# Patient Record
Sex: Female | Born: 1980
Health system: Southern US, Community
[De-identification: ages and names within clinical notes are randomized; demographics above are authoritative.]

## PROBLEM LIST (undated history)

## (undated) DIAGNOSIS — F502 Bulimia nervosa, unspecified: Secondary | ICD-10-CM

## (undated) DIAGNOSIS — I10 Essential (primary) hypertension: Secondary | ICD-10-CM

## (undated) DIAGNOSIS — R51 Headache: Secondary | ICD-10-CM

## (undated) HISTORY — DX: Bulimia nervosa: F50.2

## (undated) HISTORY — DX: Bulimia nervosa, unspecified: F50.20

## (undated) HISTORY — DX: Headache: R51

## (undated) HISTORY — DX: Essential (primary) hypertension: I10

---
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
## 2000-04-17 ENCOUNTER — Other Ambulatory Visit: Admission: RE | Admit: 2000-04-17 | Discharge: 2000-04-17 | Payer: Self-pay | Admitting: Family Medicine

## 2001-04-02 ENCOUNTER — Other Ambulatory Visit: Admission: RE | Admit: 2001-04-02 | Discharge: 2001-04-02 | Payer: Self-pay | Admitting: *Deleted

## 2002-03-28 DIAGNOSIS — R51 Headache: Secondary | ICD-10-CM

## 2002-03-28 HISTORY — DX: Headache: R51

## 2002-04-06 ENCOUNTER — Other Ambulatory Visit: Admission: RE | Admit: 2002-04-06 | Discharge: 2002-04-06 | Payer: Self-pay | Admitting: *Deleted

## 2003-04-12 ENCOUNTER — Other Ambulatory Visit: Admission: RE | Admit: 2003-04-12 | Discharge: 2003-04-12 | Payer: Self-pay | Admitting: *Deleted

## 2004-05-09 ENCOUNTER — Other Ambulatory Visit: Admission: RE | Admit: 2004-05-09 | Discharge: 2004-05-09 | Payer: Self-pay | Admitting: *Deleted

## 2005-05-10 ENCOUNTER — Other Ambulatory Visit: Admission: RE | Admit: 2005-05-10 | Discharge: 2005-05-10 | Payer: Self-pay | Admitting: Obstetrics and Gynecology

## 2006-05-13 ENCOUNTER — Other Ambulatory Visit: Admission: RE | Admit: 2006-05-13 | Discharge: 2006-05-13 | Payer: Self-pay | Admitting: Obstetrics & Gynecology

## 2007-05-28 ENCOUNTER — Other Ambulatory Visit: Admission: RE | Admit: 2007-05-28 | Discharge: 2007-05-28 | Payer: Self-pay | Admitting: Obstetrics & Gynecology

## 2008-06-03 ENCOUNTER — Other Ambulatory Visit: Admission: RE | Admit: 2008-06-03 | Discharge: 2008-06-03 | Payer: Self-pay | Admitting: Obstetrics & Gynecology

## 2010-09-26 ENCOUNTER — Ambulatory Visit (INDEPENDENT_AMBULATORY_CARE_PROVIDER_SITE_OTHER): Payer: BC Managed Care – PPO | Admitting: Family Medicine

## 2010-09-26 ENCOUNTER — Encounter: Payer: Self-pay | Admitting: Family Medicine

## 2010-09-26 VITALS — BP 130/87 | HR 85 | Ht 69.0 in | Wt 170.0 lb

## 2010-09-26 DIAGNOSIS — M6750 Plica syndrome, unspecified knee: Secondary | ICD-10-CM

## 2010-09-26 DIAGNOSIS — M6751 Plica syndrome, right knee: Secondary | ICD-10-CM | POA: Insufficient documentation

## 2010-09-26 NOTE — Progress Notes (Signed)
Patient presents with 12 day h/o r sided knee pain after running. No audible pop was heard. The patient has not had an effusion. No symptomatic giving-way. No mechanical clicking. Joint has not locked up. Patient has been able to walk but has not been able to run. The patient does have pain going up and down stairs or rising from a seated position.   Pain location: medial Current physical activity: runs 15 miles a week Prior Knee Surgery: none Bracing: none  The PMH, PSH, Social History, Family History, Medications, and allergies have been reviewed in [place], and have been updated if relevant.  GEN: Well-developed,well-nourished,in no acute distress; alert,appropriate and cooperative throughout examination HEENT: Normocephalic and atraumatic without obvious abnormalities. Ears, externally no deformities PULM: Breathing comfortably in no respiratory distress EXT: No clubbing, cyanosis, or edema PSYCH: Normally interactive. Cooperative during the interview. Pleasant. Friendly and conversant. Not anxious or depressed appearing. Normal, full affect.  Gait: Normal heel toe pattern ROM: WNL Effusion: neg Echymosis or edema: none Patellar tendon NT Painful PLICA: VERY TENDER MEDIAL PLICA BAND Patellar grind: negative Medial and lateral patellar facet loading: negative medial and lateral joint lines:NT Mcmurray's neg Flexion-pinch neg Varus and valgus stress: stable Lachman: neg Ant and Post drawer: neg Hip abduction, IR, ER: WNL Hip flexion str: 5/5 Hip abd: 5/5 Quad: 5/5 VMO atrophy:No Hamstring concentric and eccentric: 5/5  A/P: Knee Pain, Plica syndrome  Plica Syndrome  Painful plica band ID'd on exam Massage for 8 weeks with hand multiple times a day and with an ice cube  If fails, would do a plica injection along band of tissue.  Ultimately, if all treatment fails, surgical synovectomy could be considered.

## 2010-11-21 ENCOUNTER — Ambulatory Visit: Payer: BC Managed Care – PPO | Admitting: Family Medicine

## 2011-12-30 ENCOUNTER — Ambulatory Visit (INDEPENDENT_AMBULATORY_CARE_PROVIDER_SITE_OTHER): Payer: BC Managed Care – PPO | Admitting: Family Medicine

## 2011-12-30 VITALS — BP 155/90 | HR 81 | Temp 98.7°F | Resp 16 | Ht 69.5 in | Wt 168.0 lb

## 2011-12-30 DIAGNOSIS — G43909 Migraine, unspecified, not intractable, without status migrainosus: Secondary | ICD-10-CM

## 2011-12-30 DIAGNOSIS — G44209 Tension-type headache, unspecified, not intractable: Secondary | ICD-10-CM

## 2011-12-30 MED ORDER — TIZANIDINE HCL 4 MG PO TABS: 4.0000 mg | ORAL_TABLET | Freq: Four times a day (QID) | ORAL | Status: AC | PRN

## 2011-12-30 NOTE — Progress Notes (Signed)
31 year old female who is here with a history of having had migraines in the past. She had some chiropractic work done last year and has not had migraines since then until recently. She had a major visual aura that lasted about 40 minutes. She went to see her eye doctor who sent her to her PCP. The PCP assessed her and felt this was a migraine aura without headache. She had a carotid test done at that time which was normal. Last night she had given developed a headache. It started behind her left eye back to the back of her neck. Her husband did feel a knot over the lateral aspect of the neck. He is concerned this could be of arterial not. The headache is down relief. She took some BC powder. She is otherwise a healthy lady. She is a runner, and both of these episodes happen happened when she been running and probably get to dry on hot days.  Objective: Pleasant alert oriented lady in no acute distress. TMs normal. Eyes PERRLA. EOMs intact. Throat clear. Neck supple without nodes thyromegaly. No carotid bruits. Chest clear to auscultation. Heart regular without murmurs.  Assessment: Migraine with tension vascular component  Plan: Give her some Zanaflex to have on hand for if this happens again. Return if worse like

## 2011-12-30 NOTE — Patient Instructions (Addendum)
Migraine Headache A migraine headache is an intense, throbbing pain on one or both sides of your head. The exact cause of a migraine headache is not always known. A migraine may be caused when nerves in the brain become irritated and release chemicals that cause swelling within blood vessels, causing pain. Many migraine sufferers have a family history of migraines. Before you get a migraine you may or may not get an aura. An aura is a group of symptoms that can predict the beginning of a migraine. An aura may include:  Visual changes such as:   Flashing lights.   Bright spots or zig-zag lines.   Tunnel vision.   Feelings of numbness.   Trouble talking.   Muscle weakness.  SYMPTOMS  Pain on one or both sides of your head.   Pain that is pulsating or throbbing in nature.   Pain that is severe enough to prevent daily activities.   Pain that is aggravated by any daily physical activity.   Nausea (feeling sick to your stomach), vomiting, or both.   Pain with exposure to bright lights, loud noises, or activity.   General sensitivity to bright lights or loud noises.  MIGRAINE TRIGGERS Examples of triggers of migraine headaches include:   Alcohol.   Smoking.   Stress.   It may be related to menses (female menstruation).   Aged cheeses.   Foods or drinks that contain nitrates, glutamate, aspartame, or tyramine.   Lack of sleep.   Chocolate.   Caffeine.   Hunger.   Medications such as nitroglycerine (used to treat chest pain), birth control pills, estrogen, and some blood pressure medications.  DIAGNOSIS  A migraine headache is often diagnosed based on:  Symptoms.   Physical examination.   A computerized X-ray scan (computed tomography, CT) of your head.  TREATMENT  Medications can help prevent migraines if they are recurrent or should they become recurrent. Your caregiver can help you with a medication or treatment program that will be helpful to you.   Lying  down in a dark, quiet room may be helpful.   Keeping a headache diary may help you find a trend as to what may be triggering your headaches.  SEEK IMMEDIATE MEDICAL CARE IF:   You have confusion, personality changes or seizures.   You have headaches that wake you from sleep.   You have an increased frequency in your headaches.   You have a stiff neck.   You have a loss of vision.   You have muscle weakness.   You start losing your balance or have trouble walking.   You feel faint or pass out.  MAKE SURE YOU:   Understand these instructions.   Will watch your condition.   Will get help right away if you are not doing well or get worse.  Document Released: 05/14/2005 Document Revised: 05/03/2011 Document Reviewed: 12/28/2008 [facility] Patient Information 2012 [place], [place].  Try 1/2 pill of zanaflex and see if that will help

## 2012-09-03 ENCOUNTER — Other Ambulatory Visit: Payer: Self-pay | Admitting: Obstetrics & Gynecology

## 2012-09-03 ENCOUNTER — Other Ambulatory Visit: Payer: Self-pay

## 2012-09-03 MED ORDER — DROSPIRENONE-ETHINYL ESTRADIOL 3-0.02 MG PO TABS: 1.0000 | ORAL_TABLET | Freq: Every day | ORAL | Status: DC

## 2012-09-03 NOTE — Telephone Encounter (Signed)
Pharmacy request [facility] for patient supply. Pt has aex 09/04/12

## 2012-09-04 ENCOUNTER — Ambulatory Visit (INDEPENDENT_AMBULATORY_CARE_PROVIDER_SITE_OTHER): Payer: Federal, State, Local not specified - PPO | Admitting: Obstetrics & Gynecology

## 2012-09-04 ENCOUNTER — Encounter: Payer: Self-pay | Admitting: Obstetrics & Gynecology

## 2012-09-04 VITALS — BP 138/88 | Ht 69.0 in | Wt 165.0 lb

## 2012-09-04 DIAGNOSIS — Z01419 Encounter for gynecological examination (general) (routine) without abnormal findings: Secondary | ICD-10-CM

## 2012-09-04 DIAGNOSIS — R809 Proteinuria, unspecified: Secondary | ICD-10-CM

## 2012-09-04 DIAGNOSIS — Z0000 Encounter for general adult medical examination without abnormal findings: Secondary | ICD-10-CM

## 2012-09-04 LAB — POCT URINALYSIS DIPSTICK
Bilirubin, UA: NEGATIVE
Blood, UA: NEGATIVE
Glucose, UA: NEGATIVE
Nitrite, UA: NEGATIVE
Urobilinogen, UA: NEGATIVE
pH, UA: 5

## 2012-09-04 MED ORDER — DROSPIRENONE-ETHINYL ESTRADIOL 3-0.02 MG PO TABS: 1.0000 | ORAL_TABLET | Freq: Every day | ORAL | Status: DC

## 2012-09-04 NOTE — Progress Notes (Signed)
32 y.o. G0P0000 UnknownCaucasianF here for annual exam.  Reports having cyst from scalp removed in December.  Was benign. Cycles are regular.  Work is very Biochemist, clinical for a new job.  Has noticed right nipple is more erect than the opposite side.  She did have some itching at the same time.  She was in the [place] on vacation when she first noticed this.    Patient's last menstrual period was 08/29/2012.          Sexually active: yes  The current method of family planning is OCP (estrogen/progesterone).    Exercising: yes  running and weights Smoker:  no  Health Maintenance: Pap:  08/08/11 WNL/negative HR HPV MMG:  none Colonoscopy:  none BMD:   none TDaP:  2008   reports that she has never smoked. She has never used smokeless tobacco. She reports that  drinks alcohol. She reports that she does not use illicit drugs.  Past Medical History  Diagnosis Date  . Bulimic   . Headache 11/03    Past Surgical History  Procedure Laterality Date  . Knee arthroscopy  1999    right knee    Current Outpatient Prescriptions  Medication Sig Dispense Refill  . drospirenone-ethinyl estradiol (GIANVI) 3-0.02 MG tablet Take 1 tablet by mouth daily.  3 Package  0  . Multiple Vitamins-Minerals (MULTIVITAMIN PO) Take by mouth daily.       No current facility-administered medications for this visit.    Family History  Problem Relation Age of Onset  . Breast cancer Paternal Grandmother     ROS:  Pertinent items are noted in HPI.  Otherwise, a comprehensive ROS was negative.  Exam:   BP 138/88  Ht 5\' 9"  (1.753 m)  Wt 165 lb (74.844 kg)  BMI 24.36 kg/m2  LMP 08/29/2012  Height:   Height: 5\' 9"  (175.3 cm)  Ht Readings from Last 3 Encounters:  09/04/12 5\' 9"  (1.753 m)  12/30/11 5' 9.5" (1.765 m)  09/26/10 5\' 9"  (1.753 m)    General appearance: alert, cooperative and appears stated age Head: Normocephalic, without obvious abnormality, atraumatic Neck: no adenopathy, supple,  symmetrical, trachea midline and thyroid normal to inspection and palpation Lungs: clear to auscultation bilaterally Breasts: normal appearance, no masses or tenderness Heart: regular rate and rhythm Abdomen: soft, non-tender; bowel sounds normal; no masses,  no organomegaly Extremities: extremities normal, atraumatic, no cyanosis or edema Skin: Skin color, texture, turgor normal. No rashes or lesions Lymph nodes: Cervical, supraclavicular, and axillary nodes normal. No abnormal inguinal nodes palpated Neurologic: Grossly normal   Pelvic: External genitalia:  no lesions              Urethra:  normal appearing urethra with no masses, tenderness or lesions              Bartholins and Skenes: normal                 Vagina: normal appearing vagina with normal color and discharge, no lesions              Cervix: no lesions              Pap taken: no Bimanual Exam:  Uterus:  normal size, contour, position, consistency, mobility, non-tender              Adnexa: no mass, fullness, tenderness               Rectovaginal: Confirms  Anus:  normal sphincter tone, no lesions  A:  Well Woman with normal exam H/o migraines without aura that resolved after seeing chiropractor Family Hx of colon polyps (mat aunt and mat uncle ages 21 and 55) Remote H/O bulemia Increased BP with increased stress at work Proteinuria on Dip U/A today  P:  RF for birth control given.  She knows with htn, there is an increased stroke risk.  She will get BP cuff and check BPs.  We will check on her in about two months. Return after hydrating well for urinalysis.  If protein still present, check 24 hour urine return annually or prn  An After Visit Summary was printed and given to the patient.

## 2012-09-04 NOTE — Patient Instructions (Signed)

## 2012-09-05 LAB — URINALYSIS, MICROSCOPIC ONLY
Bacteria, UA: NONE SEEN
Crystals: NONE SEEN
Squamous Epithelial / LPF: NONE SEEN

## 2012-09-10 ENCOUNTER — Other Ambulatory Visit: Payer: Self-pay | Admitting: Obstetrics & Gynecology

## 2012-09-10 ENCOUNTER — Ambulatory Visit (INDEPENDENT_AMBULATORY_CARE_PROVIDER_SITE_OTHER): Payer: Federal, State, Local not specified - PPO

## 2012-09-10 DIAGNOSIS — R809 Proteinuria, unspecified: Secondary | ICD-10-CM

## 2012-09-15 ENCOUNTER — Other Ambulatory Visit: Payer: Self-pay | Admitting: Obstetrics & Gynecology

## 2012-09-15 ENCOUNTER — Other Ambulatory Visit: Payer: Federal, State, Local not specified - PPO

## 2012-09-15 ENCOUNTER — Encounter: Payer: Self-pay | Admitting: *Deleted

## 2012-09-15 DIAGNOSIS — R809 Proteinuria, unspecified: Secondary | ICD-10-CM

## 2012-09-15 NOTE — Progress Notes (Signed)
Here for urine check again.  Urine sample last week leaked and there was not enough for the test.    Patient has been checking BP's at home--systolic 140's to 160's/55-60's.  No headache or blurry vision.  Reports mother and maternal grandmother with hypertension and on medication.  Advised to stop OPCs and switch to either micronor or Depo Provera or consider IUD use.    Patient will start with trial of micronor.  Rx to pharmacy of choice.  Follow-up one month.  She will keep monitoring BP's.  If readings >150/90, patient advised TCB.

## 2012-09-16 ENCOUNTER — Telehealth: Payer: Self-pay

## 2012-09-16 MED ORDER — NORETHINDRONE 0.35 MG PO TABS: 1.0000 | ORAL_TABLET | Freq: Every day | ORAL | Status: DC

## 2012-09-16 NOTE — Telephone Encounter (Addendum)
Spoke with patient re: elevated blood pressure. She would like to start micronor and see if she does well on this. AEX scheduled 09/15/13, micronor 1 po q d # 1/11RF sent to [facility]. Patient aware to stop current ocp and start the micronor tonight. Will call back prn. Patient will start micronor today and continue to check her blood pressure. If stays elevated will call otherwise will keep 1 month follow up appointment.

## 2012-09-16 NOTE — Telephone Encounter (Deleted)
Patient will start micronor today and continue to check her blood pressure. If stays elevated will call otherwise will keep 1 month follow up appointment.

## 2012-10-07 ENCOUNTER — Telehealth: Payer: Self-pay | Admitting: Obstetrics & Gynecology

## 2012-10-07 NOTE — Telephone Encounter (Signed)
Needs PCP appt to start BP meds.  Please see if can be seen at [facility] quickly.

## 2012-10-07 NOTE — Telephone Encounter (Signed)
Blood pressure is 165/104 patient reports. She was told to call if it got this high.

## 2012-10-07 NOTE — Telephone Encounter (Signed)
PATIENT NOTIFIED OF NEED TO CALL HER PCP TO BE SEEN ASAP. PATIENT STATES SHE HAS BEEN TO [facility] AND WILL CALL TO GET APPT.  WITH SOMEONE THERE. INSTRUCTED TO DO THIS TODAY AND  TO CALL BACK  WITH INFORMATION OF HER VISIT FOR DR. MILLER .

## 2012-10-07 NOTE — Telephone Encounter (Signed)
PATIENT STATES PAST COUPLE DAYS BP READINGS HAVE BEEN IN 141/104 RANGE. THIS MORNING TOOK BEFORE GOING ON HER RUN AND WAS 165/104. TOOK EVERY 5 MINUTES AFTER THAT AND DID COME BACK DOWN TO 144/104/ SHE DID NOT GO ON HER RUN. PLEASE ADVISE/ Sheila Adams

## 2012-10-10 ENCOUNTER — Other Ambulatory Visit: Payer: Self-pay | Admitting: Family Medicine

## 2012-10-10 DIAGNOSIS — I10 Essential (primary) hypertension: Secondary | ICD-10-CM

## 2012-10-16 ENCOUNTER — Ambulatory Visit
Admission: RE | Admit: 2012-10-16 | Discharge: 2012-10-16 | Disposition: A | Discharge status: Home / Self Care | Payer: Federal, State, Local not specified - PPO | Source: Ambulatory Visit | Attending: Family Medicine | Admitting: Family Medicine

## 2012-10-16 DIAGNOSIS — I10 Essential (primary) hypertension: Secondary | ICD-10-CM

## 2012-10-17 ENCOUNTER — Encounter: Payer: Self-pay | Admitting: Obstetrics & Gynecology

## 2012-10-21 ENCOUNTER — Ambulatory Visit: Payer: Federal, State, Local not specified - PPO | Admitting: Obstetrics & Gynecology

## 2013-04-02 ENCOUNTER — Other Ambulatory Visit: Payer: Self-pay

## 2013-09-15 ENCOUNTER — Encounter: Payer: Self-pay | Admitting: Obstetrics & Gynecology

## 2013-09-15 ENCOUNTER — Ambulatory Visit (INDEPENDENT_AMBULATORY_CARE_PROVIDER_SITE_OTHER): Payer: Federal, State, Local not specified - PPO | Admitting: Obstetrics & Gynecology

## 2013-09-15 VITALS — BP 144/90 | HR 64 | Resp 16 | Ht 68.75 in | Wt 174.4 lb

## 2013-09-15 DIAGNOSIS — Z0000 Encounter for general adult medical examination without abnormal findings: Secondary | ICD-10-CM

## 2013-09-15 DIAGNOSIS — Z124 Encounter for screening for malignant neoplasm of cervix: Secondary | ICD-10-CM

## 2013-09-15 DIAGNOSIS — Z01419 Encounter for gynecological examination (general) (routine) without abnormal findings: Secondary | ICD-10-CM

## 2013-09-15 LAB — POCT URINALYSIS DIPSTICK
Bilirubin, UA: NEGATIVE
GLUCOSE UA: NEGATIVE
Ketones, UA: NEGATIVE
NITRITE UA: NEGATIVE
Protein, UA: NEGATIVE
RBC UA: NEGATIVE
UROBILINOGEN UA: NEGATIVE
pH, UA: 5

## 2013-09-15 MED ORDER — PRINIVIL 10 MG PO TABS: 10.0000 mg | ORAL_TABLET | Freq: Every day | ORAL | Status: DC

## 2013-09-15 MED ORDER — NORETHINDRONE 0.35 MG PO TABS: 1.0000 | ORAL_TABLET | Freq: Every day | ORAL | Status: DC

## 2013-09-15 NOTE — Patient Instructions (Signed)

## 2013-09-15 NOTE — Progress Notes (Signed)
33 y.o. G0P0000 Married CaucasianF here for annual exam.  Father has CTE.  He played college football and had many concussions.  He is committed at a nursing home.  Pt is his legal guardian.  Parents divorced 15 years ago.  [place].    Patient not interested in having children.  Cycles are unpredictable.  Last only one to two days.  Frustrated with irregular cycles.    Still running.  Stopped BP medication in December just to see if blood pressure would stay lower.  It hasn't.    Seeing PCP regularly.  Has done 24 hr urine due to proteinuria.  Renal ultrasound was normal.  This was fine.  Should have follow-up with PCP yearly.  Patient's last menstrual period was 09/07/2013.          Sexually active: yes  The current method of family planning is oral progesterone-only contraceptive.    Exercising: yes  running Smoker:  no  Health Maintenance: Pap:  08/08/11 WNL/negative HR HPV History of abnormal Pap:  no MMG:  none Colonoscopy:  none BMD:   none TDaP:  2008 Screening Labs: none today, Hb today: none today, Urine today: WBC-trace   reports that she has never smoked. She has never used smokeless tobacco. She reports that she drinks about 4 ounces of alcohol per week. She reports that she does not use illicit drugs.  Past Medical History  Diagnosis Date  . Bulimic   . Headache(784.0) 11/03  . Hypertension     Past Surgical History  Procedure Laterality Date  . Knee arthroscopy  1999    right knee    Current Outpatient Prescriptions  Medication Sig Dispense Refill  . Multiple Vitamins-Minerals (MULTIVITAMIN PO) Take by mouth daily.      . norethindrone (MICRONOR,CAMILA,ERRIN) 0.35 MG tablet Take 1 tablet (0.35 mg total) by mouth daily.  1 Package  13   No current facility-administered medications for this visit.    Family History  Problem Relation Age of Onset  . Breast cancer Paternal Grandmother   . Hypertension Maternal Grandmother   .  Hypertension Mother   . Cancer Maternal Uncle     form of sarcoma  . Other Father     CTE    ROS:  Pertinent items are noted in HPI.  Otherwise, a comprehensive ROS was negative.  Exam:   BP 144/90  Pulse 64  Resp 16  Ht 5' 8.75" (1.746 m)  Wt 174 lb 6.4 oz (79.107 kg)  BMI 25.95 kg/m2  LMP 09/07/2013  Height: 5' 8.75" (174.6 cm)  Ht Readings from Last 3 Encounters:  09/15/13 5' 8.75" (1.746 m)  09/04/12 5\' 9"  (1.753 m)  12/30/11 5' 9.5" (1.765 m)    General appearance: alert, cooperative and appears stated age Head: Normocephalic, without obvious abnormality, atraumatic Neck: no adenopathy, supple, symmetrical, trachea midline and thyroid normal to inspection and palpation Lungs: clear to auscultation bilaterally Breasts: normal appearance, no masses or tenderness Heart: regular rate and rhythm Abdomen: soft, non-tender; bowel sounds normal; no masses,  no organomegaly Extremities: extremities normal, atraumatic, no cyanosis or edema Skin: Skin color, texture, turgor normal. No rashes or lesions Lymph nodes: Cervical, supraclavicular, and axillary nodes normal. No abnormal inguinal nodes palpated Neurologic: Grossly normal   Pelvic: External genitalia:  no lesions              Urethra:  normal appearing urethra with no masses, tenderness or lesions  Bartholins and Skenes: normal                 Vagina: normal appearing vagina with normal color and discharge, no lesions              Cervix: no lesions              Pap taken: yes Bimanual Exam:  Uterus:  normal size, contour, position, consistency, mobility, non-tender              Adnexa: normal adnexa and no mass, fullness, tenderness               Rectovaginal: Confirms               Anus:  normal sphincter tone, no lesions  A:  Well Woman with normal exam Hypertension.  Was evaluated for proteinuria.  W/U was fine.   H/O migraines with aura, resolved with chiropractor Family hx of colon polyps (m aunt  and m uncle ages 7354, 5660) Remote hx of bulemia   P:   Mammogram starting age 33. Colonoscopy mid 40's [facility]  Precert first.  Pt will decide after that information is provided.  Would recommend Cytotec pre-procedure.  As on micronor, schedule when convenient.  Doesn't need to wait for cycle. pap smear return annually or prn  An After Visit Summary was printed and given to the patient.

## 2013-09-16 ENCOUNTER — Telehealth: Payer: Self-pay | Admitting: Obstetrics & Gynecology

## 2013-09-16 DIAGNOSIS — Z30430 Encounter for insertion of intrauterine contraceptive device: Secondary | ICD-10-CM

## 2013-09-16 MED ORDER — MISOPROSTOL 200 MCG PO TABS: ORAL_TABLET | ORAL | Status: DC

## 2013-09-16 NOTE — Telephone Encounter (Signed)
Spoke with patient. Advised that mirena insertion is covered at 100% of allowable. Patient agreeable. Transferred to triage to schedule.

## 2013-09-16 NOTE — Telephone Encounter (Signed)
Left message to call Sheila Adams at [phone].   Offer 4/27 at 4:00pm with Dr.Miller. Cytotec ordered instructions needed. Pre IUD insertion instructions needed.

## 2013-09-16 NOTE — Telephone Encounter (Signed)
Left message to call Neev Mcmains at [phone]. 

## 2013-09-16 NOTE — Telephone Encounter (Signed)
Message copied by FRANKLIN, SAVangie BickerBRINA S on Wed Sep 16, 2013 11:09 AM ------      Message from: Jerene BearsMILLER, MARY S      Created: Tue Sep 15, 2013  1:36 PM      Regarding: precert IUD       Please precert Mirena IUD for pt.              MSM ------

## 2013-09-17 NOTE — Telephone Encounter (Signed)
Pt returning call

## 2013-09-17 NOTE — Telephone Encounter (Signed)
Left message to call Hildreth Orsak at [phone]. 

## 2013-09-17 NOTE — Telephone Encounter (Signed)
Patient is calling kaitlyn back °

## 2013-09-17 NOTE — Telephone Encounter (Signed)
Left message to call Ishika Chesterfield at [phone]. 

## 2013-09-17 NOTE — Telephone Encounter (Signed)
Spoke with patient. Patient would like to schedule IUD insertion at this time. Appointment scheduled for 4/27 at 1600 with Dr.Miller. Patient agreeable to date and time. Cytotec instructions given.1 tablet PV night before the procedure.  1 tablet PV the morning of the procedure. Advised cytotec sent to pharmacy of choice. Pre procedure instructions given.  Motrin instructions given. Motrin=Advil=Ibuprofen, 800 mg one hour before appointment. Eat a meal and hydrate well before appointment. Patient agreeable and verbalizes understanding. Patient states that she decided she would like IUD for 3 years instead of five. Patient aware of benefit coverage. Advised would let Dr.Miller know. Patient will call back with any further needs, questions, or concerns.  Routing to provider for final review. Patient agreeable to disposition. Will close encounter.

## 2013-09-21 ENCOUNTER — Ambulatory Visit (INDEPENDENT_AMBULATORY_CARE_PROVIDER_SITE_OTHER): Payer: Federal, State, Local not specified - PPO | Admitting: Obstetrics & Gynecology

## 2013-09-21 ENCOUNTER — Telehealth: Payer: Self-pay | Admitting: Obstetrics & Gynecology

## 2013-09-21 VITALS — BP 128/82 | HR 58 | Resp 16 | Ht 68.75 in | Wt 174.0 lb

## 2013-09-21 DIAGNOSIS — Z30430 Encounter for insertion of intrauterine contraceptive device: Secondary | ICD-10-CM

## 2013-09-21 LAB — IPS PAP SMEAR ONLY

## 2013-09-21 NOTE — Progress Notes (Signed)
33 y.o. 250P0000 Married Caucasian female presents for  insertion of Skyla IUD. She has been counseled about alternative forms of birth control including oral contraceptives, progesterone methods, IUD, barrier method, and sterilization.  She has decided to proceed with IUD placement.  Currently, she denies any vaginal symptoms or STD concerns.  LMP:  Patient's last menstrual period was 09/20/2013.  Healthy female:  WNWD WF, NAD  Pelvic exam: Vulva:  normal Vagina:   normal vagina, no discharge, exudate, lesion, or erythema Cervix:Non-tender, Negative CMT, no lesions or redness Uterus:normal shape, position and consistency   After patient read information booklet and all questions were answered, informed consent was obtained.    Procedure:  Speculum inserted into vagina. Cervix visualized and cleansed with betadine solution X 3. Paracervical block was placed.  1% Lidocaine was used.  8 cc total.  Lot #: [id number].  Exp: 12/26/13.  Tenaculum placed on cervix at 12 o'clock position.  Uterus sounded to 7 centimeters.  IUD removed from sterile packet and under sterile conditions inserted to fundus of uterus.  Introducer removed without difficulty.  IUD string trimmed to 2 centimeters.  Remainder string given to patient to feel for identification.  Tenaculum removed.  minimal bleeding noted.  Speculum removed.  Uterus palpated normal.  Patient tolerated procedure well.  IUD Lot #:[id number].  Exp: 2/17.  Package information attached to consent and scanned into EPIC.  A: Insertion of SKYLA   P: Return to office 6 weeks for recheck Pt knows IUD needs to be replaced approximately 3 years Instructions provided.

## 2013-09-21 NOTE — Telephone Encounter (Signed)
Spoke with patient. Patient states that she started menses today and would like to know if this will interfere with IUD insertion today. Advised patient starting menses today will not interfere with IUD insertion. Advised procedure is typically scheduled days 1-7 of cycle with day one being first day of menses. So starting cycle today is not a problem. Patient agreeable and verbalizes understanding.   Routing to provider for final review. Patient agreeable to disposition. Will close encounter.

## 2013-09-21 NOTE — Telephone Encounter (Signed)
Patient would like to talk with a nurse before coming to IUD insertion appointment this afternoon.

## 2013-09-24 NOTE — Addendum Note (Signed)
Addended by: Jerene BearsMILLER, Demarie Hyneman S on: 09/24/2013 06:24 PM   Modules accepted: Level of Service

## 2013-10-14 IMAGING — US US RENAL
1 series · 13 of 25 positions shown · non-contrast
Comparison: None.

RENAL/URINARY TRACT ULTRASOUND

CLINICAL DATA: Hypertension, evaluate for renal artery stenosis

RENAL/URINARY TRACT ULTRASOUND
RENAL DUPLEX ULTRASOUND
TECHNIQUE: Routine ultrasound examination of the kidneys and
urinary tract was performed.  Duplex and color Doppler ultrasound
was utilized to evaluate blood flow in the renal arteries and
kidneys.

[Series 1: us renal · 13 of 78 slices shown]
[im 1/78]
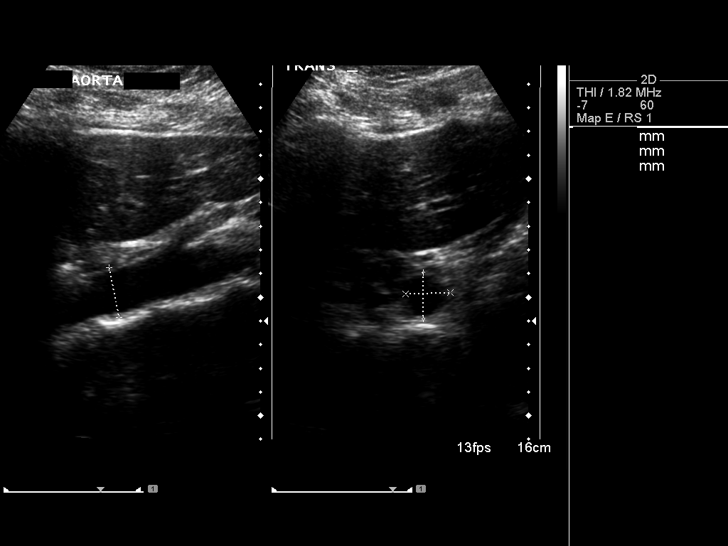
[im 7/78]
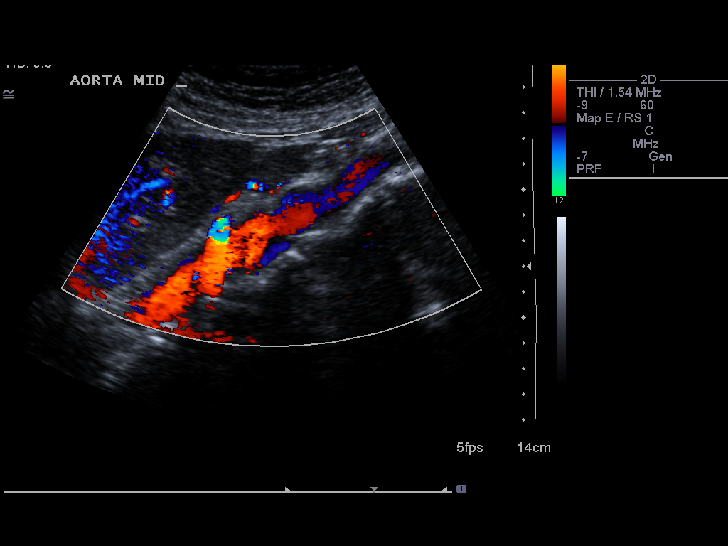
[im 13/78]
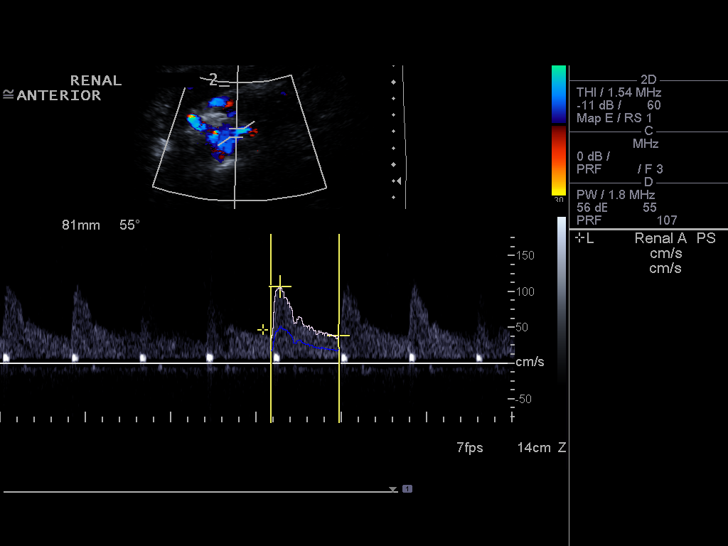
[im 20/78]
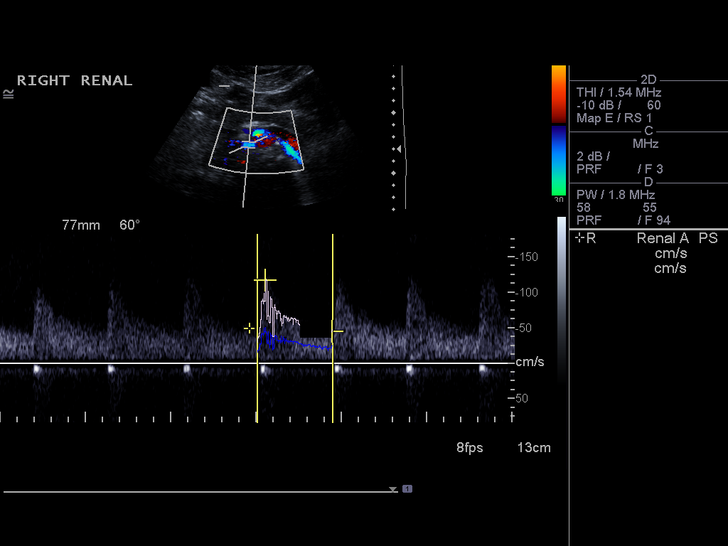
[im 26/78]
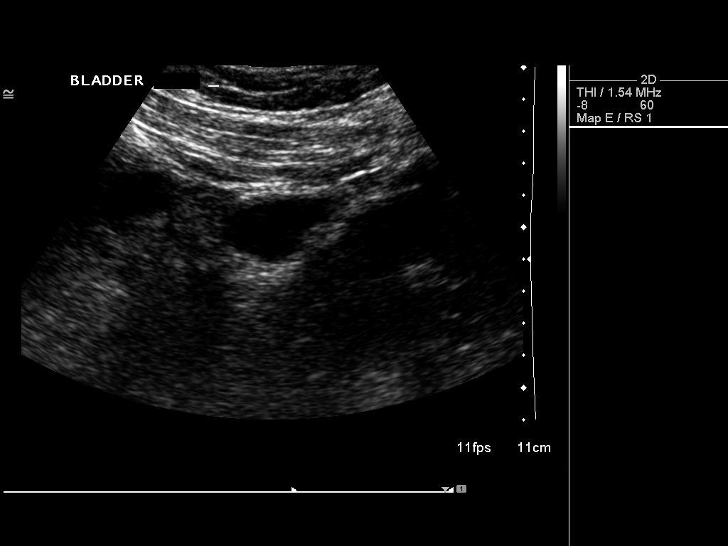
[im 33/78]
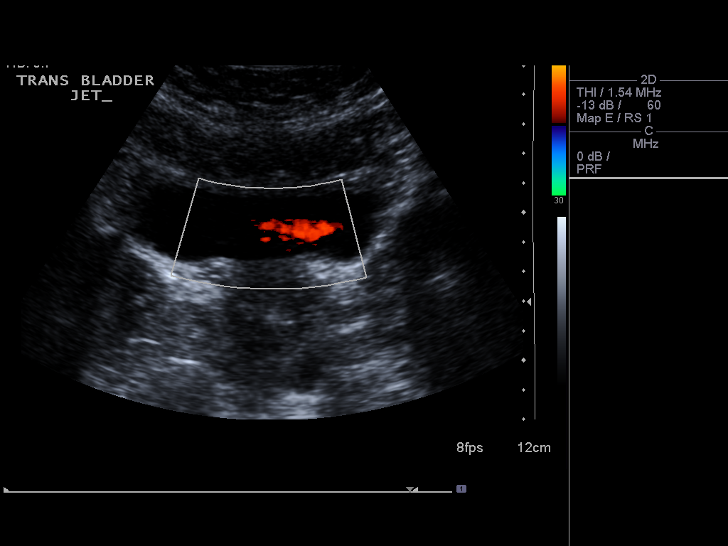
[im 39/78]
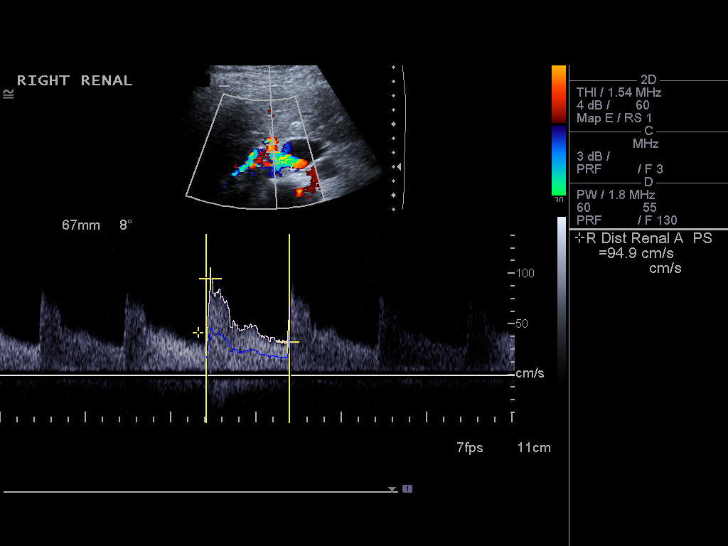
[im 45/78]
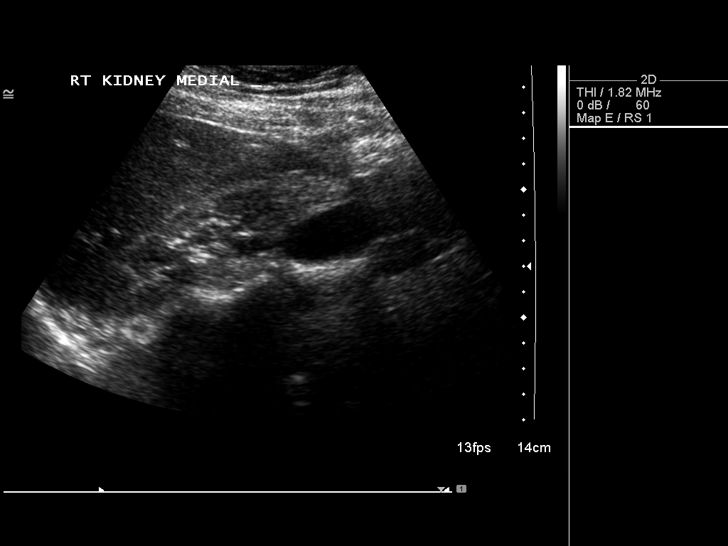
[im 52/78]
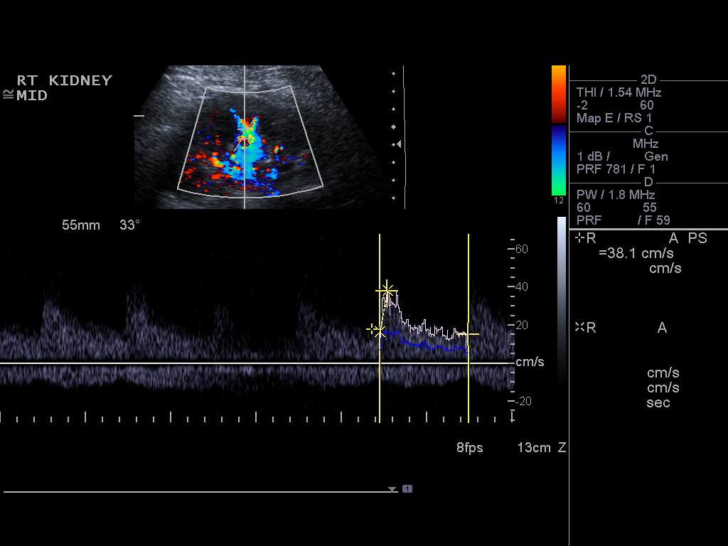
[im 58/78]
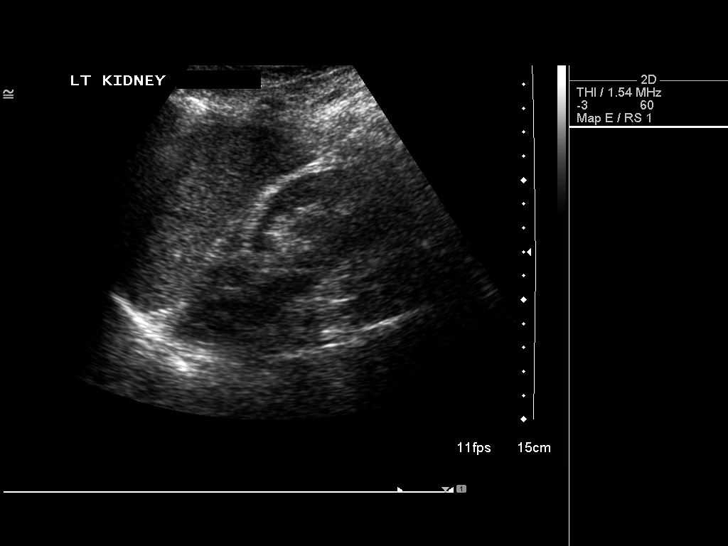
[im 65/78]
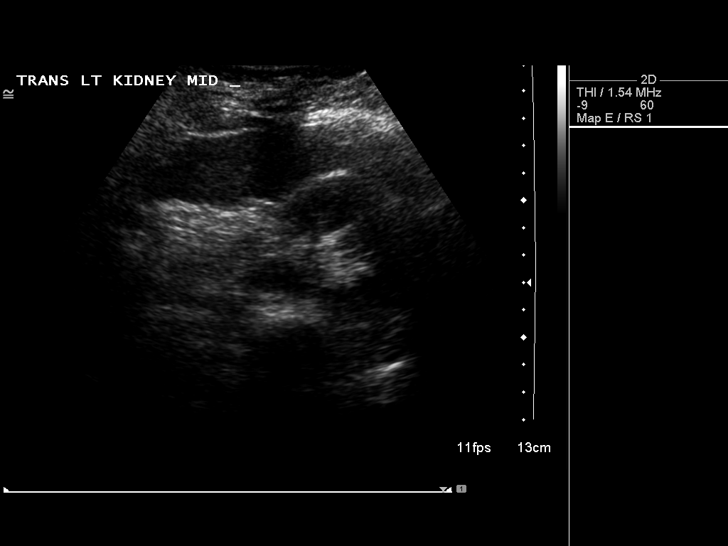
[im 71/78]
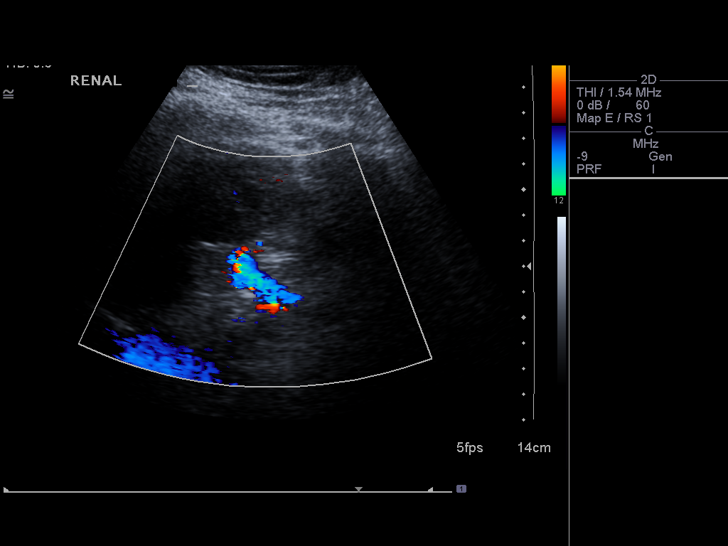
[im 78/78]
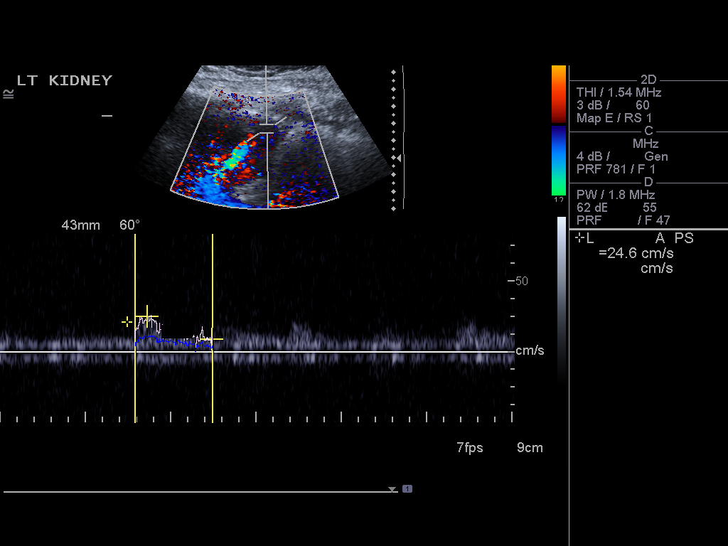

[13 of 25 positions shown; findings below may reference images not displayed]

FINDINGS: Right Kidney:  Normal cortical thickness, echogenicity and size,
measuring 11.7 cm in length.  No focal renal lesions.  No echogenic
renal stones.  No urinary obstruction.

Left Kidney:  Normal cortical thickness, echogenicity and size,
measuring 11.2 cm in length. There is an approximate 1.8 x 1.4 x
1.7 cm anechoic partially exophytic cyst arising from the inferior
pole left kidney (images 70 and 71).  No echogenic renal stones.
No urinary obstruction.

Bladder:  Normal given degree of distension.  Bilateral ureteral
jets are visualized.

---------------------------------------------

RENAL DUPLEX ULTRASOUND

Right Renal Artery Velocities

Origin: 121 cm/sec
Mid: 134 cm/sec
Hilum: 129 cm/sec
Interlobar: 49 cm/sec
Arcuate: 22 cm/sec

Left Renal Artery Velocities - note is made of duplicated left-
sided renal arteries labeled "superior/anterior" and
"inferior/posterior"

Origin: superior/anterior - 107 cm/sec; inferior/posterior - 111
cm/sec
Mid: superior/anterior - 135 cm/sec; inferior/posterior - 94 cm/sec
Hilum: Superior/anterior - 77 cm/sec; inferior/posterior - 82
cm/sec

Interlobar: 38 cm/sec
Arcuate: 27 cm/sec

Aortic Velocity: 112 cm/sec

Right Renal Aortic Ratios

Origin:
Mid:
Hilum:
Interlobar:
Arcuate:

Left Renal Aortic Ratios

Origin: superior/anterior - 0.96; inferior/posterior -
Mid: superior/anterior - 1.21; inferior/posterior -
Hilum: superior/anterior - 0.69; inferior/posterior -
Interlobar:
Arcuate:0.25
FINDINGS: Note is made of duplicated left-sided renal arteries.

Normal velocities and wave forms are demonstrated throughout the
entirety of the right renal artery, right kidney, duplicated left
renal arteries and left kidney.

The bilateral renal veins are widely patent.
IMPRESSION: 1.  No evidence of renal artery stenosis.
2.  Incidental note is made of duplicated left-sided renal
arteries.

3.  Left-sided approximately 1.8 cm exophytic renal cyst.
Otherwise, unremarkable renal ultrasound.  No evidence of urinary
obstruction.

## 2013-11-03 ENCOUNTER — Other Ambulatory Visit: Payer: Federal, State, Local not specified - PPO | Admitting: Obstetrics & Gynecology

## 2013-11-03 ENCOUNTER — Ambulatory Visit (INDEPENDENT_AMBULATORY_CARE_PROVIDER_SITE_OTHER): Payer: Federal, State, Local not specified - PPO

## 2013-11-03 ENCOUNTER — Ambulatory Visit (INDEPENDENT_AMBULATORY_CARE_PROVIDER_SITE_OTHER): Payer: Federal, State, Local not specified - PPO | Admitting: Obstetrics & Gynecology

## 2013-11-03 VITALS — BP 140/80 | HR 60 | Resp 16 | Ht 68.75 in | Wt 181.0 lb

## 2013-11-03 DIAGNOSIS — R102 Pelvic and perineal pain unspecified side: Secondary | ICD-10-CM

## 2013-11-03 DIAGNOSIS — N949 Unspecified condition associated with female genital organs and menstrual cycle: Secondary | ICD-10-CM

## 2013-11-03 DIAGNOSIS — Z30431 Encounter for routine checking of intrauterine contraceptive device: Secondary | ICD-10-CM

## 2013-11-03 DIAGNOSIS — N83209 Unspecified ovarian cyst, unspecified side: Secondary | ICD-10-CM

## 2013-11-03 DIAGNOSIS — Z975 Presence of (intrauterine) contraceptive device: Secondary | ICD-10-CM

## 2013-11-03 DIAGNOSIS — I10 Essential (primary) hypertension: Secondary | ICD-10-CM

## 2013-11-03 NOTE — Progress Notes (Signed)
Subjective:     Patient ID: Sheila Adams, female   DOB: 1980/12/27, 33 y.o.   MRN: [id number]  HPI 33 yo G0P0 MWF here for IUD recheck.  Didn't have much cramping after IUD was placed.  Had 7 days of heavy spotting about a week ago. This was when her cycle was due.  No pain with intercourse.  However, she is having some nagging pain over the last two weeks on the left side, especially with certain movements and wants to be sure there is no problem.  No abnormal discharge.  No fever.   Review of Systems  All other systems reviewed and are negative.      Objective:   Physical Exam  Constitutional: She appears well-developed and well-nourished.  Abdominal: Soft. Bowel sounds are normal. She exhibits no distension. There is no tenderness. There is no rebound and no guarding.  Genitourinary: Vagina normal and uterus normal. There is no rash or tenderness on the right labia. There is no rash or tenderness on the left labia. Uterus is not tender. Cervix exhibits no discharge (and normal 2.0cm IUD string noted). Right adnexum displays no mass and no tenderness. Left adnexum displays no mass and no tenderness.  Lymphadenopathy:       Right: No inguinal adenopathy present.       Left: No inguinal adenopathy present.   Due to pain and pt's anxiety about it, TVUS performed today.  Images below. Uterus 8.0 x 4.8 x 3.2cm with normally placed intracavitary IUD Endometrium 2.34m Left ovary 3.7 x 2.8 x 1.9cm with 22 x 68mm corpus luteal cyst Right ovary 3.6 x 2.2 x 2.0cm Cul de sac:  Small amount of free fluid adjacent to left ovary    Assessment:     Left corpus luteal cyst  Normally placed IUD    Plan:     Pt reassured.  Advised to notify office if pain does not fully resolve over next two weeks.    ~15 minutes spent with patient >50% of time was in face to face discussion of above.

## 2013-11-06 ENCOUNTER — Encounter: Payer: Self-pay | Admitting: Obstetrics & Gynecology

## 2013-11-06 DIAGNOSIS — I10 Essential (primary) hypertension: Secondary | ICD-10-CM | POA: Insufficient documentation

## 2013-11-06 DIAGNOSIS — Z975 Presence of (intrauterine) contraceptive device: Secondary | ICD-10-CM | POA: Insufficient documentation

## 2014-01-13 ENCOUNTER — Telehealth: Payer: Self-pay | Admitting: Obstetrics & Gynecology

## 2014-01-13 ENCOUNTER — Telehealth: Payer: Self-pay

## 2014-01-13 DIAGNOSIS — Z30432 Encounter for removal of intrauterine contraceptive device: Secondary | ICD-10-CM

## 2014-01-13 NOTE — Telephone Encounter (Signed)
Spoke with patient. Patient states that she is unable to make appointment next Thursday. Appointment rescheduled to next available with Dr.Miller for 9/4 at 3:30pm. Patient agreeable to date and time. Patient would like to be put on wait list if someone cancels before then. Placed on wait list in EPIC.   Routing to provider for final review. Patient agreeable to disposition. Will close encounter

## 2014-01-13 NOTE — Telephone Encounter (Signed)
Pt would like an appointment to have her iud removed.

## 2014-01-13 NOTE — Telephone Encounter (Signed)
Spoke with patient. Patient states that she would like to have IUD removed at this time. States "It is wonderful and everything is fine. My husband and I have just decided to try to have a baby." Patient would like to schedule removal at this time and states that she may want IUD again later on. Appointment scheduled for next week 8/27 at 10am. Patient agreeable to date and time. Patient is aware that she will need to give 72 hours notice for cancellation or reschedule. Order in but will need precert.  Cc: Cathrine MusterSabrina Franklin  Routing to provider for final review. Patient agreeable to disposition. Will close encounter

## 2014-01-21 ENCOUNTER — Ambulatory Visit: Payer: Federal, State, Local not specified - PPO | Admitting: Obstetrics & Gynecology

## 2014-01-29 ENCOUNTER — Encounter: Payer: Self-pay | Admitting: Obstetrics & Gynecology

## 2014-01-29 ENCOUNTER — Ambulatory Visit (INDEPENDENT_AMBULATORY_CARE_PROVIDER_SITE_OTHER): Payer: Federal, State, Local not specified - PPO | Admitting: Obstetrics & Gynecology

## 2014-01-29 VITALS — BP 140/84 | HR 72 | Ht 68.75 in | Wt 176.0 lb

## 2014-01-29 DIAGNOSIS — Z30432 Encounter for removal of intrauterine contraceptive device: Secondary | ICD-10-CM

## 2014-01-30 NOTE — Progress Notes (Signed)
Patient ID: Sheila Adams, female   DOB: Jun 09, 1980, 33 y.o.   MRN: [id number]  33 y.o. G0P0000 Married Caucasian female presents for  removal of Skyla IUD.  She has decided to wants to try for pregnancy.  Has started PNV.  Has a few questions about what she should do after IUD removed and if she needs to have anything else done for "testing" right now.   Healthy female: WNWD WF, NAD  Pelvic exam: Vulva:normal female genitalia Vagina:normal vagina, no discharge, exudate, lesion, or erythema Cervix:Non-tender, Negative CMT, no lesions or redness, IUD strings  Procedure:  Strings graped with ringed forceps.  With one pull, IUD was removed.  Pt tolerated procedure well, all instruments removed.  IUD shown to pt prior to discarding.    A: s/p IUD removal  P: Pt will start keeping menstrual calendar.  If cycles appear regular, can also do ovulation testing.  If no pregnancy in 6 months, pt to call for consultation.  If cycles not regular after 3 months, pt also should call for consultation.  Continue PNV.

## 2014-06-28 ENCOUNTER — Telehealth: Payer: Self-pay | Admitting: Obstetrics & Gynecology

## 2014-06-28 NOTE — Telephone Encounter (Signed)
Left message to reschedule aex appointment. °

## 2014-09-08 ENCOUNTER — Telehealth: Payer: Self-pay | Admitting: Obstetrics & Gynecology

## 2014-09-08 NOTE — Telephone Encounter (Signed)
Spoke with patient. She has been unable to conceive after trying for 8 months. Appointment scheduled with Dr. Hyacinth MeekerMiller to discuss. 09/20/14 appointment with Dr. Hyacinth MeekerMiller, patient agreeable.

## 2014-09-08 NOTE — Telephone Encounter (Signed)
Patient wants to talk with the nurse she states it has been eight months and she is still trying tot get pregnant. She states Dr. Hyacinth MeekerMiller told her to call in six months.

## 2014-09-20 ENCOUNTER — Encounter: Payer: Self-pay | Admitting: Obstetrics & Gynecology

## 2014-09-20 ENCOUNTER — Ambulatory Visit (INDEPENDENT_AMBULATORY_CARE_PROVIDER_SITE_OTHER): Payer: Federal, State, Local not specified - PPO | Admitting: Obstetrics & Gynecology

## 2014-09-20 VITALS — BP 126/86 | HR 80 | Resp 16 | Wt 178.2 lb

## 2014-09-20 DIAGNOSIS — Z0184 Encounter for antibody response examination: Secondary | ICD-10-CM

## 2014-09-20 DIAGNOSIS — N978 Female infertility of other origin: Secondary | ICD-10-CM | POA: Diagnosis not present

## 2014-09-20 MED ORDER — LETROZOLE 2.5 MG PO TABS: ORAL_TABLET | ORAL | Status: DC

## 2014-09-20 NOTE — Progress Notes (Signed)
33 yrs Caucasian MWF G0P0000 here for discussion of desires to be pregnant.  Pt has been keeping tract of cycles.  Cycles are regular.  Reports she didn't start ovulation testing until after Christmas and has noted + tests as early as day 7 and as late as day 24.  She is doing testing through entire month and has never had a test with more than 1 positive day.  Is currently taking a PNV.  Cycle due to start tomorrow.  PMed hx:  hypertension PSurg Hx: neg Smoker: No  Spouse PMed Hx: neg Spouse PSurg hx:  neg Spouse smokes: Yes  D/w pt components of fertility evaluation including testing for ovulation, semen analysis testing, and evaluation of cavity of uterus and for tubal patency.  Speed of this evaluation and proceeding with fertility medication will depend on desires and pt and her spouse.   She is not sure at this time is she wants to proceed with anything high tech.  Assessment:  Primary infertility probably due to luteal phase defect  Plan:   1.  Semen analysis kit and order given.  2.  Consider day 3 FSH if not pregnant in three months.   3.  Start Femara with next cycle.  7.5mg daily days 5-9.  Twin and triplet rate discussed.  Risks of mood change and ovarian cyst formation reviewed as well.  4.  Day 23 progesterone, May 18th if cycle start tomorrow (like it should).  5.  Plan SHGM to evaluate for tubal patency.  Pt understands this is done timed with menstrual cycle days 1-9.  6.  On PNV  7.  Rubella testing not done.  Will do with day 23 progesterone lab.  ~25 minutes spent with patient >50% of time was in face to face discussion of above.   

## 2014-09-23 ENCOUNTER — Ambulatory Visit: Payer: Federal, State, Local not specified - PPO | Admitting: Obstetrics & Gynecology

## 2014-09-27 ENCOUNTER — Telehealth: Payer: Self-pay | Admitting: Obstetrics & Gynecology

## 2014-09-27 DIAGNOSIS — N978 Female infertility of other origin: Secondary | ICD-10-CM

## 2014-09-27 NOTE — Telephone Encounter (Signed)
Spoke with Sheila Adams, she will enter referral.

## 2014-09-27 NOTE — Telephone Encounter (Signed)
Referral submitted to Dr Lyndal RainbowYalcinkaya's office.

## 2014-09-27 NOTE — Telephone Encounter (Signed)
Pt states she cannot schedule her husband for a semen analysis at [facility] until a referral is sent. Pt requests call when referral sent.  [phone]

## 2014-09-27 NOTE — Telephone Encounter (Signed)
Call to patient. Advised that referral has been submitted to Dr April MansonYalcinkaya. She should hear from his office in a few days. Patient agreeable.

## 2014-09-29 ENCOUNTER — Telehealth: Payer: Self-pay | Admitting: Obstetrics & Gynecology

## 2014-09-29 NOTE — Telephone Encounter (Signed)
Patient calling to let Dr. Hyacinth MeekerMiller know she dropped off her husbands semen sample this morning at [facility].

## 2014-09-29 NOTE — Telephone Encounter (Signed)
Called patient. She is advised that message was received and that we will call with results from semen analysis. Will hold until results note received from Dr. Hyacinth MeekerMiller.

## 2014-10-01 NOTE — Telephone Encounter (Signed)
Attempted call to pt.  No answer.  No message left for pt.  Result is sitting on my desk if needs to be referenced.  No sperm seen on analysis.  Repeat testing in 4 weeks recommended.  If no sperm seen, husband will need karyotype and Y chromosome microdeletion analysis to see if testicular sperm extraction could be a possibility or if donor sperm is only option for pt/spouse for

## 2014-10-04 NOTE — Telephone Encounter (Signed)
Spoke with Sheila Adams at Dr. Charlett Lango office.   Sheila Adams is scheduled for repeat semen analysis at [facility] for 11/01/14 at 1400. Semen analysis kit up front for patient to pick up when she comes in for 5/19 lab appointment.  Consult with Dr. Kerin Perna regarding results/new patient is scheduled for 11/15/14.   Spoke with patient and this information is given. She is agreeable.   Additional referral with orders sent to Dr. Charlett Lango office with fax confirmation received.

## 2014-10-04 NOTE — Telephone Encounter (Signed)
Pt returning call

## 2014-10-04 NOTE — Telephone Encounter (Signed)
Returned call to patient. She is advised of message from Dr.Miller. She is advised husband will need repeat sperm analysis in 4 weeks. She is advised to keep appointment for lab draw for progesterone check. Advised will need to schedule consult with Dr. April MansonYalcinkaya and will schedule now for appointment to have after repeat semen analysis results and possible blood work from husband. Patient agreeable to plan. She asks I coordinate appointments if possible. Husband is off on Monday's and is this best for them.

## 2014-10-14 ENCOUNTER — Other Ambulatory Visit (INDEPENDENT_AMBULATORY_CARE_PROVIDER_SITE_OTHER): Payer: Federal, State, Local not specified - PPO

## 2014-10-14 DIAGNOSIS — N978 Female infertility of other origin: Secondary | ICD-10-CM

## 2014-10-14 DIAGNOSIS — Z0184 Encounter for antibody response examination: Secondary | ICD-10-CM

## 2014-10-15 LAB — RUBELLA SCREEN: RUBELLA: 7.87 {index} — AB (ref <0.90)

## 2014-10-15 LAB — PROGESTERONE: Progesterone: 8 ng/mL

## 2014-10-18 NOTE — Telephone Encounter (Signed)
Patient is asking for recent lab results done 10/14/14. Patient said she was told the results would be in last Friday.

## 2014-10-18 NOTE — Telephone Encounter (Signed)
Routing result request to Dr. Hyacinth MeekerMiller for review and advice. Will send copies of results to Dr. April MansonYalcinkaya for appointment.

## 2014-10-19 ENCOUNTER — Telehealth: Payer: Self-pay | Admitting: Emergency Medicine

## 2014-10-19 DIAGNOSIS — N970 Female infertility associated with anovulation: Secondary | ICD-10-CM

## 2014-10-19 MED ORDER — LETROZOLE 2.5 MG PO TABS: ORAL_TABLET | ORAL | Status: DC

## 2014-10-19 NOTE — Telephone Encounter (Signed)
Message left to return call to Kylena Mole at [phone].    

## 2014-10-19 NOTE — Telephone Encounter (Signed)
Incoming call from patient. She is asking if she can schedule Sonohysterogram at this time prior to appointment with Dr. April MansonYalcinkaya on 11/15/14.   Order in notes from Dr. Hyacinth MeekerMiller  09/20/14.   Order placed for  insurance pre certification. She is advised she will be contacted with out of pocket costs of procedure and is agreeable. Aware must be completed on cycle days 1-9. Patient expects cycle to start today. She is advised to call as soon as cycle starts to schedule procedure. May be able to add to schedule for 10/21/14. Patient agreeable.

## 2014-10-19 NOTE — Telephone Encounter (Signed)
Can do SHGM if before day 9 of her cycle.

## 2014-10-19 NOTE — Telephone Encounter (Signed)
Detailed message left for patient per her request as she will be in meetings today. She is advised that Dr. Hyacinth MeekerMiller ordered Femara for her to start with cycle days 5-9. Ordered as below to [facility]. Patient advised to return call with any questions.  Routing to provider for final review. Patient agreeable to disposition. Will close encounter.

## 2014-10-19 NOTE — Telephone Encounter (Signed)
Spoke with patient and message from Dr. Hyacinth MeekerMiller given. Patient verbalized understanding.  Patient has a new patient appointment with spouse on 11/15/14 with Dr. April MansonYalcinkaya.  I have faxed copies of these labs to their office.  Patient thinks she may start her cycle. Do you want her to continue with Femara?

## 2014-10-19 NOTE — Telephone Encounter (Signed)
Please let pt know that she rubella status is immune.  However, the progesterone level is low and does not indicated ovulation.  When is appt scheduled?  Ok to continue with this but Dr. April MansonYalcinkaya may suggest more aggressive treatment.

## 2014-10-19 NOTE — Telephone Encounter (Signed)
Yes.  Ok to continue with 7.5mg  days 5-9 of cycles (3 tabs of 2.5mg  daily).  #15 tabs/0RF.

## 2014-10-20 NOTE — Telephone Encounter (Signed)
Patient is returning a call to Tracy. °

## 2014-10-20 NOTE — Telephone Encounter (Signed)
Spoke with patient. She states she started her cycle yesterday and would like to schedule Sonohysterogram procedure. LMP 10/19/14.   Scheduled procedure with Dr. Hyacinth MeekerMiller for 10/21/14. Motrin 800 mg po one hour before with food. Patient agreeable.  Routing to provider for final review. Patient agreeable to disposition. Will close encounter.

## 2014-10-21 ENCOUNTER — Other Ambulatory Visit: Payer: Self-pay | Admitting: Obstetrics & Gynecology

## 2014-10-21 ENCOUNTER — Ambulatory Visit (INDEPENDENT_AMBULATORY_CARE_PROVIDER_SITE_OTHER): Payer: Federal, State, Local not specified - PPO

## 2014-10-21 ENCOUNTER — Ambulatory Visit (INDEPENDENT_AMBULATORY_CARE_PROVIDER_SITE_OTHER): Payer: Federal, State, Local not specified - PPO | Admitting: Obstetrics & Gynecology

## 2014-10-21 VITALS — BP 122/78 | Resp 18 | Ht 68.75 in | Wt 178.2 lb

## 2014-10-21 DIAGNOSIS — N978 Female infertility of other origin: Secondary | ICD-10-CM | POA: Diagnosis not present

## 2014-10-21 DIAGNOSIS — N970 Female infertility associated with anovulation: Secondary | ICD-10-CM

## 2014-10-21 DIAGNOSIS — N4601 Organic azoospermia: Secondary | ICD-10-CM

## 2014-10-21 NOTE — Progress Notes (Signed)
34 y.o. G0 MWF here for a pelvic ultrasound with sonohystogram due to luteal phase defect and tubal patency assessment.  Husband's recent semen analysis with no sperm present.  Pt has been referred to Dr. April MansonYalcinkaya.  Repeat semen analysis planned.  If same results, spouse will be referred to urology for testicular biopsy to see if there is sperm at all for possible IVF.  They are moving toewards this, as a couple, if needed.  Pt started Femara last much.  Day 23 progesterone (took Femara days 5-9) was 8.  Today is day three of cycle so will check day three FSH.  Pt has a lot more cramping during the month.  Advised pt not to continue this month due ot semen analysis.  Likelihood of spontaneous pregnancy is very low so need to make a medication that may give her symptoms/side effects.   LMP:  10/19/14.  Technique:  Both transabdominal and transvaginal ultrasound examinations of the pelvis were performed. Transabdominal technique was performed for global imaging of the pelvis including uterus, ovaries, adnexal regions, and pelvic cul-de-sac.  It was necessary to proceed with endovaginal exam following the abdominal ultrasound transabdominal exam to visualize the endometrium and adnexa.  Color and duplex Doppler ultrasound was utilized to evaluate blood flow to the ovaries.   FINDINGS: Uterus: 6.4 x 4.9 x 3.1cm Endometrium: 5.611mm Adnexa:  Left: 2.7 x 1.8 x 1.6cm with 1.1cm follicle     Right: 3.5 x 2.1 x 2.7 Cul de sac: no free fluid  SHSG:  After obtaining appropriate verbal consent from patient, the cervix was visualized using a speculum, and prepped with betadine.  A tenaculum  was not applied to the cervix.  Dilation of the cervix was not necessary. The catheter was passed into the uterus and sterile saline introduced, with the following findings: no intracavitary lesion and bilateral tubal spill.  Assessment: Primary infertility with azoospermia in spouse and possible luteal phase defect vs  anovulation in this pt  Plan:  Day 3 FSH pending from today. Pt already has appt with Dr. April MansonYalcinkaya  ~25 minutes spent with patient >50% of time was in face to face discussion of above.

## 2014-10-22 ENCOUNTER — Encounter: Payer: Self-pay | Admitting: Obstetrics & Gynecology

## 2014-10-22 DIAGNOSIS — N4601 Organic azoospermia: Secondary | ICD-10-CM | POA: Insufficient documentation

## 2014-10-22 LAB — FOLLICLE STIMULATING HORMONE: FSH: 7.4 m[IU]/mL

## 2014-10-26 ENCOUNTER — Telehealth: Payer: Self-pay | Admitting: Emergency Medicine

## 2014-10-26 NOTE — Telephone Encounter (Signed)
Message left to return call to [place] at [phone].   Results and office visit 10/21/14 with Dr. Hyacinth MeekerMiller faxed to Dr. April MansonYalcinkaya with fax confirmation received.

## 2014-10-26 NOTE — Telephone Encounter (Signed)
Patient returned call and message from Dr. Hyacinth MeekerMiller given. Patient verbalized understanding and will follow up with Dr. April MansonYalcinkaya as scheduled with him on 11/15/14. Husband scheduled for repeat semen analysis on 11/01/14. Routing to provider for final review. Patient agreeable to disposition. Will close encounter.

## 2014-10-26 NOTE — Telephone Encounter (Signed)
-----   Message from Jerene BearsMary S Miller, MD sent at 10/22/2014  4:33 AM EDT ----- Inform pt day 3 FSH done yesterday was in normal range.  Note and FSH and day 23 progesterone all needs to be faxed to Dr. April MansonYalcinkaya.  She has upcoming appt with him.  Thanks.

## 2014-11-08 ENCOUNTER — Telehealth: Payer: Self-pay | Admitting: Emergency Medicine

## 2014-11-08 NOTE — Telephone Encounter (Signed)
Called patient with handwritten results from Dr. Hyacinth Meeker from semen analysis from [facility] showing Azoospermia and need for genetic blood work to be drawn. Advised waiting to hear back from phlebotomist at [facility] to see if they can draw at their office or if will need to be drawn at our office. Advised will return call with response and plan for lab draw.

## 2014-11-08 NOTE — Telephone Encounter (Signed)
Called and spoke with Velna Hatchet office Production designer, theatre/television/film at Dr. Lyndal Rainbow office. She states that spouse of patient can have labs drawn at [facility] office. Appointment made for today at 1600 for lab draw. Handwritten lab order faxed to attention Velna Hatchet at Dr. Lyndal Rainbow office with fax confirmation received.   Spoke with patient and instructions given.  Keeping consult appointment with Dr. April Manson as scheduled for 11/15/14.   Patient coming by this office to pick up copies of both semen analysis and copies are at front desk for patient to sign records release for. Print production planner, Armed forces operational officer [place] aware.   Routing to provider for final review. Patient agreeable to disposition. Will close encounter.

## 2014-11-17 ENCOUNTER — Telehealth: Payer: Self-pay | Admitting: Obstetrics & Gynecology

## 2014-11-17 NOTE — Telephone Encounter (Signed)
Left message regarding upcoming appointment has been canceled and needs to be rescheduled. °

## 2014-12-01 ENCOUNTER — Ambulatory Visit: Payer: Federal, State, Local not specified - PPO | Admitting: Obstetrics & Gynecology

## 2014-12-21 ENCOUNTER — Ambulatory Visit (INDEPENDENT_AMBULATORY_CARE_PROVIDER_SITE_OTHER): Payer: Federal, State, Local not specified - PPO | Admitting: Obstetrics & Gynecology

## 2014-12-21 ENCOUNTER — Encounter: Payer: Self-pay | Admitting: Obstetrics & Gynecology

## 2014-12-21 VITALS — BP 168/98 | HR 60 | Resp 16 | Ht 69.0 in | Wt 180.4 lb

## 2014-12-21 DIAGNOSIS — Z0000 Encounter for general adult medical examination without abnormal findings: Secondary | ICD-10-CM

## 2014-12-21 DIAGNOSIS — N912 Amenorrhea, unspecified: Secondary | ICD-10-CM

## 2014-12-21 DIAGNOSIS — Z01419 Encounter for gynecological examination (general) (routine) without abnormal findings: Secondary | ICD-10-CM | POA: Diagnosis not present

## 2014-12-21 LAB — POCT URINALYSIS DIPSTICK
Bilirubin, UA: NEGATIVE
GLUCOSE UA: NEGATIVE
Ketones, UA: NEGATIVE
Leukocytes, UA: NEGATIVE
Nitrite, UA: NEGATIVE
PH UA: 7
PROTEIN UA: NEGATIVE
RBC UA: NEGATIVE
UROBILINOGEN UA: NEGATIVE

## 2014-12-21 LAB — TSH: TSH: 3.098 u[IU]/mL (ref 0.350–4.500)

## 2014-12-21 LAB — POCT URINE PREGNANCY: Preg Test, Ur: NEGATIVE

## 2014-12-21 NOTE — Progress Notes (Signed)
34 y.o. G0P0000 Married CaucasianF here for annual exam.  Doing well.  Husband has tried to stop smoking.  He is on Chantix.  Will have repeat semen analysis in three to four weeks.  He will then have a testicular biopsy to see if there is sperm that can be done for IVF and ICSI.    Spotted for five days this month but didn't have cycle in the middle of the month like she should have.  Has had a day three FSH.    Feeling increased stress with all of the fertility issues.  Aware of BP today.  Taking it at home since switched to [place].  Reports this is normal at home.    Patient's last menstrual period was 12/06/2014.          Sexually active: Yes.    The current method of family planning is none.    Exercising: Yes.    running 3 x weekly, weights 1 day/week Smoker:  no  Health Maintenance: Pap:  09/16/13 WNL, 2013 neg pap with neg HR HPV. History of abnormal Pap:  no MMG:  none Colonoscopy:  none BMD:   none TDaP:  2008 Screening Labs: recent, Hb today: recent, Urine today: PH-7.0   reports that she has never smoked. She has never used smokeless tobacco. She reports that she does not drink alcohol or use illicit drugs.  Past Medical History  Diagnosis Date  . Bulimic   . Headache(784.0) 11/03  . Hypertension     Past Surgical History  Procedure Laterality Date  . Knee arthroscopy  1999    right knee    Current Outpatient Prescriptions  Medication Sig Dispense Refill  . methyldopa (ALDOMET) 250 MG tablet Take 250 mg by mouth 2 (two) times daily.  5  . Prenatal Vit-Fe Fumarate-FA (PRENATAL PO) Take by mouth daily.     No current facility-administered medications for this visit.    Family History  Problem Relation Age of Onset  . Breast cancer Paternal Grandmother   . Hypertension Maternal Grandmother   . Hypertension Mother   . Cancer Maternal Uncle     form of sarcoma  . Other Father     CTE    ROS:  Pertinent items are noted in HPI.  Otherwise, a comprehensive  ROS was negative.  Exam:   BP 168/98 mmHg  Pulse 60  Resp 16  Ht 5\' 9"  (1.753 m)  Wt 180 lb 6.4 oz (81.829 kg)  BMI 26.63 kg/m2  LMP 12/06/2014  Weight change: +6#  Height: 5\' 9"  (175.3 cm)  Ht Readings from Last 3 Encounters:  12/21/14 5\' 9"  (1.753 m)  10/21/14 5' 8.75" (1.746 m)  01/29/14 5' 8.75" (1.746 m)    General appearance: alert, cooperative and appears stated age Head: Normocephalic, without obvious abnormality, atraumatic Neck: no adenopathy, supple, symmetrical, trachea midline and thyroid normal to inspection and palpation Lungs: clear to auscultation bilaterally Breasts: normal appearance, no masses or tenderness Heart: regular rate and rhythm Abdomen: soft, non-tender; bowel sounds normal; no masses,  no organomegaly Extremities: extremities normal, atraumatic, no cyanosis or edema Skin: Skin color, texture, turgor normal. No rashes or lesions Lymph nodes: Cervical, supraclavicular, and axillary nodes normal. No abnormal inguinal nodes palpated Neurologic: Grossly normal   Pelvic: External genitalia:  no lesions              Urethra:  normal appearing urethra with no masses, tenderness or lesions  Bartholins and Skenes: normal                 Vagina: normal appearing vagina with normal color and discharge, no lesions              Cervix: no lesions              Pap taken: No. Bimanual Exam:  Uterus:  normal size, contour, position, consistency, mobility, non-tender              Adnexa: normal adnexa and no mass, fullness, tenderness               Rectovaginal: Confirms               Anus:  normal sphincter tone, no lesions  Chaperone was present for exam.  A:  Well Woman with normal exam Hypertension H/O migraines with aura, resolved with chiropractor Family hx of colon polyps (m aunt and m uncle ages 14, 66) Remote hx of bulemia  Primary infertility due to her husband's absence of sperm  P: Mammogram starting age 69 Colonoscopy  mid 52's On PNV FSH, TSH, qual HCG  return annually or prn

## 2014-12-22 ENCOUNTER — Telehealth: Payer: Self-pay | Admitting: Obstetrics & Gynecology

## 2014-12-22 LAB — HCG, SERUM, QUALITATIVE: Preg, Serum: NEGATIVE

## 2014-12-22 LAB — FOLLICLE STIMULATING HORMONE: FSH: 14.3 m[IU]/mL

## 2014-12-22 NOTE — Telephone Encounter (Signed)
Return call to patient. Advised serum HCG negative and all  tests still pending MD review. Will contact patient back after MD review.

## 2014-12-22 NOTE — Telephone Encounter (Signed)
Patient calling for results.

## 2014-12-23 NOTE — Telephone Encounter (Signed)
My Chart message sent.  Ok to close encounter.

## 2015-05-03 ENCOUNTER — Ambulatory Visit (INDEPENDENT_AMBULATORY_CARE_PROVIDER_SITE_OTHER): Payer: Federal, State, Local not specified - PPO | Admitting: Family Medicine

## 2015-05-03 VITALS — BP 150/80 | HR 83 | Temp 99.0°F | Resp 16 | Ht 70.0 in | Wt 179.2 lb

## 2015-05-03 DIAGNOSIS — I10 Essential (primary) hypertension: Secondary | ICD-10-CM | POA: Diagnosis not present

## 2015-05-03 DIAGNOSIS — J029 Acute pharyngitis, unspecified: Secondary | ICD-10-CM | POA: Diagnosis not present

## 2015-05-03 LAB — POCT RAPID STREP A (OFFICE): Rapid Strep A Screen: NEGATIVE

## 2015-05-03 MED ORDER — MAGIC MOUTHWASH W/LIDOCAINE
10.0000 mL | ORAL | Status: DC | PRN
Start: 2015-05-03 — End: 2016-03-23

## 2015-05-03 MED ORDER — HYDROCODONE-HOMATROPINE 5-1.5 MG/5ML PO SYRP: 5.0000 mL | ORAL_SOLUTION | ORAL | Status: DC | PRN

## 2015-05-03 NOTE — Progress Notes (Signed)
Subjective:    Patient ID: Sheila Adams, female    DOB: 1980/12/23, 34 y.o.   MRN: [id number] Chief Complaint  Patient presents with  . Sore Throat    x 4 days   . Headache    HPI  Sxs began 2d ago with severe sore throat. Yesterday she developed a cough and HA with cervical adenopathy, treated with lozenges and Goody's.  No fever/chills, no decongestants or other otc cold meds.  Today cough resolved but very severe pharyngitis and got strep a lot as a kid. Nodes tender. Has a frontal headache which is different from her usual migraine or sinus headaches (which are usu behind her Lt eye).  Has been put on methyldopa when she started to try to conceive and has been on about this dose for several years.  Does have a BP cuff at home  Has been seen at [facility] by Dr. Paulino Rily but difficult to get an appt so thinking about switching to come here.  Under a lot of stress. Moving right now and living with her mom who is a Runner, broadcasting/film/video. Has been trying to conceive for a long time with her husband so was switched to aldomet sev yrs ago but she doesn't think it is going to happen so would be fine to change to a different med if indicated. Has a BP cuff at home but has not used recently.  Past Medical History  Diagnosis Date  . Bulimic   . Headache(784.0) 11/03  . Hypertension    Current Outpatient Prescriptions on File Prior to Visit  Medication Sig Dispense Refill  . methyldopa (ALDOMET) 250 MG tablet Take 250 mg by mouth 2 (two) times daily.  5  . Prenatal Vit-Fe Fumarate-FA (PRENATAL PO) Take by mouth daily.     No current facility-administered medications on file prior to visit.   Allergies  Allergen Reactions  . Cat Hair Extract     Review of Systems  Constitutional: Positive for activity change, appetite change and fatigue. Negative for fever, chills and diaphoresis.  HENT: Positive for congestion, postnasal drip, sore throat and trouble swallowing. Negative for ear  discharge, ear pain, mouth sores, nosebleeds, rhinorrhea, sinus pressure, sneezing and voice change.   Eyes: Negative for pain and itching.  Respiratory: Positive for cough. Negative for shortness of breath.   Cardiovascular: Negative for chest pain.  Gastrointestinal: Negative for nausea, vomiting, abdominal pain, diarrhea and constipation.  Genitourinary: Negative for dysuria.  Musculoskeletal: Positive for myalgias and neck pain. Negative for joint swelling, arthralgias, gait problem and neck stiffness.  Neurological: Positive for headaches (frontal - different from her typical migraines or sinus headaches). Negative for dizziness and syncope.  Hematological: Positive for adenopathy.  Psychiatric/Behavioral: Positive for sleep disturbance.       Objective:  BP 150/80 mmHg  Pulse 83  Temp(Src) 99 F (37.2 C) (Oral)  Resp 16  Ht  (1.778 m)  Wt 179 lb 3.2 oz (81.285 kg)  BMI 25.71 kg/m2  SpO2 99%  LMP 04/17/2015  Physical Exam  Constitutional: She is oriented to person, place, and time. She appears well-developed and well-nourished. No distress.  HENT:  Head: Normocephalic and atraumatic.  Right Ear: Tympanic membrane, external ear and ear canal normal.  Left Ear: Tympanic membrane, external ear and ear canal normal.  Nose: Mucosal edema present. No rhinorrhea.  Mouth/Throat: Uvula is midline and mucous membranes are normal. Mucous membranes are not pale and not dry. No trismus in the jaw. No  uvula swelling. Posterior oropharyngeal erythema present. No oropharyngeal exudate, posterior oropharyngeal edema or tonsillar abscesses.  Eyes: Conjunctivae are normal. Right eye exhibits no discharge. Left eye exhibits no discharge. No scleral icterus.  Neck: Normal range of motion. Neck supple.  Cardiovascular: Normal rate, regular rhythm, normal heart sounds and intact distal pulses.   Pulmonary/Chest: Effort normal and breath sounds normal. No respiratory distress.    Lymphadenopathy:       Head (right side): Tonsillar adenopathy present. No submandibular, no preauricular, no posterior auricular and no occipital adenopathy present.       Head (left side): Tonsillar adenopathy present. No submandibular, no preauricular, no posterior auricular and no occipital adenopathy present.    She has cervical adenopathy.       Right cervical: No superficial cervical and no posterior cervical adenopathy present.      Left cervical: Superficial cervical adenopathy present. No posterior cervical adenopathy present.       Right: No supraclavicular adenopathy present.       Left: No supraclavicular adenopathy present.  Neurological: She is alert and oriented to person, place, and time.  Skin: Skin is warm and dry. She is not diaphoretic. No erythema.  Psychiatric: She has a normal mood and affect. Her behavior is normal.      Results for orders placed or performed in visit on 05/03/15  POCT rapid strep A  Result Value Ref Range   Rapid Strep A Screen Negative Negative    Assessment & Plan:   1. Acute pharyngitis, unspecified etiology - suspect viral, symptomatic care, clx P    2.  HTN - on aldomet 250 bid as trying for conception. Going to monitor BP at home - has home cuff - and will call if elev 130s-140s or higher with low threshhold to increase aldomet to 500 bid if elev at home - then can f/u for HTN check sev wks after med adjustment.  Orders Placed This Encounter  Procedures  . Culture, Group A Strep  . POCT rapid strep A    Meds ordered this encounter  Medications  . HYDROcodone-homatropine (HYCODAN) 5-1.5 MG/5ML syrup    Sig: Take 5 mLs by mouth every 4 (four) hours as needed for cough.    Dispense:  120 mL    Refill:  0  . magic mouthwash w/lidocaine SOLN    Sig: Take 10 mLs by mouth every 2 (two) hours as needed for mouth pain.    Dispense:  360 mL    Refill:  0    Ok to use pharmacy formulary that is least expensive or can use 1:1:1 ratio of  viscous lidocaine: diphenhydramine: hydrocortisone     Norberto SorensonEva Brett Soza, MD MPH

## 2015-05-03 NOTE — Patient Instructions (Addendum)
I recommend frequent warm salt water gargles, hot tea with honey and lemon, rest, and handwashing.  Hot showers or breathing in steam may help loosen the congestion.     Pharyngitis Pharyngitis is redness, pain, and swelling (inflammation) of your pharynx.  CAUSES  Pharyngitis is usually caused by infection. Most of the time, these infections are from viruses (viral) and are part of a cold. However, sometimes pharyngitis is caused by bacteria (bacterial). Pharyngitis can also be caused by allergies. Viral pharyngitis may be spread from person to person by coughing, sneezing, and personal items or utensils (cups, forks, spoons, toothbrushes). Bacterial pharyngitis may be spread from person to person by more intimate contact, such as kissing.  SIGNS AND SYMPTOMS  Symptoms of pharyngitis include:   Sore throat.   Tiredness (fatigue).   Low-grade fever.   Headache.  Joint pain and muscle aches.  Skin rashes.  Swollen lymph nodes.  Plaque-like film on throat or tonsils (often seen with bacterial pharyngitis). DIAGNOSIS  Your health care provider will ask you questions about your illness and your symptoms. Your medical history, along with a physical exam, is often all that is needed to diagnose pharyngitis. Sometimes, a rapid strep test is done. Other lab tests may also be done, depending on the suspected cause.  TREATMENT  Viral pharyngitis will usually get better in 3-4 days without the use of medicine. Bacterial pharyngitis is treated with medicines that kill germs (antibiotics).  HOME CARE INSTRUCTIONS   Drink enough water and fluids to keep your urine clear or pale yellow.   Only take over-the-counter or prescription medicines as directed by your health care provider:   If you are prescribed antibiotics, make sure you finish them even if you start to feel better.   Do not take aspirin.   Get lots of rest.   Gargle with 8 oz of salt water ( tsp of salt per 1 qt of  water) as often as every 1-2 hours to soothe your throat.   Throat lozenges (if you are not at risk for choking) or sprays may be used to soothe your throat. SEEK MEDICAL CARE IF:   You have large, tender lumps in your neck.  You have a rash.  You cough up green, yellow-brown, or bloody spit. SEEK IMMEDIATE MEDICAL CARE IF:   Your neck becomes stiff.  You drool or are unable to swallow liquids.  You vomit or are unable to keep medicines or liquids down.  You have severe pain that does not go away with the use of recommended medicines.  You have trouble breathing (not caused by a stuffy nose). MAKE SURE YOU:   Understand these instructions.  Will watch your condition.  Will get help right away if you are not doing well or get worse.   This information is not intended to replace advice given to you by your health care provider. Make sure you discuss any questions you have with your health care provider.   Document Released: 05/14/2005 Document Revised: 03/04/2013 Document Reviewed: 01/19/2013 Elsevier Interactive Patient Education 2016 Elsevier Inc.  Managing Your High Blood Pressure Blood pressure is a measurement of how forceful your blood is pressing against the walls of the arteries. Arteries are muscular tubes within the circulatory system. Blood pressure does not stay the same. Blood pressure rises when you are active, excited, or nervous; and it lowers during sleep and relaxation. If the numbers measuring your blood pressure stay above normal most of the time, you are at  risk for health problems. High blood pressure (hypertension) is a long-term (chronic) condition in which blood pressure is elevated. A blood pressure reading is recorded as two numbers, such as 120 over 80 (or 120/80). The first, higher number is called the systolic pressure. It is a measure of the pressure in your arteries as the heart beats. The second, lower number is called the diastolic pressure. It is  a measure of the pressure in your arteries as the heart relaxes between beats.  Keeping your blood pressure in a normal range is important to your overall health and prevention of health problems, such as heart disease and stroke. When your blood pressure is uncontrolled, your heart has to work harder than normal. High blood pressure is a very common condition in adults because blood pressure tends to rise with age. Men and women are equally likely to have hypertension but at different times in life. Before age 81, men are more likely to have hypertension. After 35 years of age, women are more likely to have it. Hypertension is especially common in African Americans. This condition often has no signs or symptoms. The cause of the condition is usually not known. Your caregiver can help you come up with a plan to keep your blood pressure in a normal, healthy range. BLOOD PRESSURE STAGES Blood pressure is classified into four stages: normal, prehypertension, stage 1, and stage 2. Your blood pressure reading will be used to determine what type of treatment, if any, is necessary. Appropriate treatment options are tied to these four stages:  Normal  Systolic pressure (mm Hg): below 120.  Diastolic pressure (mm Hg): below 80. Prehypertension  Systolic pressure (mm Hg): 120 to 139.  Diastolic pressure (mm Hg): 80 to 89. Stage1  Systolic pressure (mm Hg): 140 to 159.  Diastolic pressure (mm Hg): 90 to 99. Stage2  Systolic pressure (mm Hg): 160 or above.  Diastolic pressure (mm Hg): 100 or above. RISKS RELATED TO HIGH BLOOD PRESSURE Managing your blood pressure is an important responsibility. Uncontrolled high blood pressure can lead to:  A heart attack.  A stroke.  A weakened blood vessel (aneurysm).  Heart failure.  Kidney damage.  Eye damage.  Metabolic syndrome.  Memory and concentration problems. HOW TO MANAGE YOUR BLOOD PRESSURE Blood pressure can be managed effectively with  lifestyle changes and medicines (if needed). Your caregiver will help you come up with a plan to bring your blood pressure within a normal range. Your plan should include the following: Education  Read all information provided by your caregivers about how to control blood pressure.  Educate yourself on the latest guidelines and treatment recommendations. New research is always being done to further define the risks and treatments for high blood pressure. Lifestylechanges  Control your weight.  Avoid smoking.  Stay physically active.  Reduce the amount of salt in your diet.  Reduce stress.  Control any chronic conditions, such as high cholesterol or diabetes.  Reduce your alcohol intake. Medicines  Several medicines (antihypertensive medicines) are available, if needed, to bring blood pressure within a normal range. Communication  Review all the medicines you take with your caregiver because there may be side effects or interactions.  Talk with your caregiver about your diet, exercise habits, and other lifestyle factors that may be contributing to high blood pressure.  See your caregiver regularly. Your caregiver can help you create and adjust your plan for managing high blood pressure. RECOMMENDATIONS FOR TREATMENT AND FOLLOW-UP  The following recommendations are  based on current guidelines for managing high blood pressure in nonpregnant adults. Use these recommendations to identify the proper follow-up period or treatment option based on your blood pressure reading. You can discuss these options with your caregiver.  Systolic pressure of 120 to 139 or diastolic pressure of 80 to 89: Follow up with your caregiver as directed.  Systolic pressure of 140 to 160 or diastolic pressure of 90 to 100: Follow up with your caregiver within 2 months.  Systolic pressure above 160 or diastolic pressure above 100: Follow up with your caregiver within 1 month.  Systolic pressure above 180  or diastolic pressure above 110: Consider antihypertensive therapy; follow up with your caregiver within 1 week.  Systolic pressure above 200 or diastolic pressure above 120: Begin antihypertensive therapy; follow up with your caregiver within 1 week.   This information is not intended to replace advice given to you by your health care provider. Make sure you discuss any questions you have with your health care provider.   Document Released: 02/06/2012 Document Reviewed: 02/06/2012 Elsevier Interactive Patient Education [employer]

## 2015-05-04 LAB — CULTURE, GROUP A STREP: Organism ID, Bacteria: NORMAL

## 2015-09-28 ENCOUNTER — Encounter (HOSPITAL_COMMUNITY): Payer: Self-pay | Admitting: Emergency Medicine

## 2015-09-28 ENCOUNTER — Ambulatory Visit (HOSPITAL_COMMUNITY)
Admission: EM | Admit: 2015-09-28 | Discharge: 2015-09-28 | Disposition: A | Discharge status: Home / Self Care | Payer: Federal, State, Local not specified - PPO | Attending: Family Medicine | Admitting: Family Medicine

## 2015-09-28 DIAGNOSIS — I10 Essential (primary) hypertension: Secondary | ICD-10-CM | POA: Insufficient documentation

## 2015-09-28 DIAGNOSIS — N3090 Cystitis, unspecified without hematuria: Secondary | ICD-10-CM | POA: Diagnosis not present

## 2015-09-28 DIAGNOSIS — Z79899 Other long term (current) drug therapy: Secondary | ICD-10-CM | POA: Diagnosis not present

## 2015-09-28 DIAGNOSIS — Z8249 Family history of ischemic heart disease and other diseases of the circulatory system: Secondary | ICD-10-CM | POA: Insufficient documentation

## 2015-09-28 DIAGNOSIS — R300 Dysuria: Secondary | ICD-10-CM | POA: Diagnosis present

## 2015-09-28 LAB — URINALYSIS, ROUTINE W REFLEX MICROSCOPIC
BILIRUBIN URINE: NEGATIVE
Glucose, UA: NEGATIVE mg/dL
Ketones, ur: NEGATIVE mg/dL
Nitrite: POSITIVE — AB
Protein, ur: NEGATIVE mg/dL
SPECIFIC GRAVITY, URINE: 1.011 (ref 1.005–1.030)
pH: 6.5 (ref 5.0–8.0)

## 2015-09-28 LAB — URINE MICROSCOPIC-ADD ON

## 2015-09-28 LAB — POCT PREGNANCY, URINE: PREG TEST UR: NEGATIVE

## 2015-09-28 MED ORDER — CEPHALEXIN 500 MG PO CAPS: 500.0000 mg | ORAL_CAPSULE | Freq: Two times a day (BID) | ORAL | Status: DC

## 2015-09-28 NOTE — Discharge Instructions (Signed)
It is a pleasure to see you today.  As we discussed, I believe you have an uncomplicated bladder infection (cystitis).  Treatment with Cephalexin 500mg , take 1 tablet by mouth twice daily for 10 days.   Your urine specimen is being sent for culture; you will be called if a change in treatment is needed.   Please mention this visit and treatment to your doctor at [facility] when you go there next week.  Also, if you have a return of the vaginal discharge you have noted.

## 2015-09-28 NOTE — ED Notes (Signed)
[facility] with a complaint of dysuria and urinary frequency x 5 days. The patient stated that she tried OTC AZO with no relief.

## 2015-09-28 NOTE — ED Provider Notes (Signed)
CSN: [id number]     Arrival date & time 09/28/15  1641 History   First MD Initiated Contact with Patient 09/28/15 1830     Chief Complaint  Patient presents with  . Dysuria  . Urinary Frequency   (Consider location/radiation/quality/duration/timing/severity/associated sxs/prior Treatment) Patient is a 35 y.o. female presenting with dysuria and frequency. The history is provided by the patient. No language interpreter was used.  Dysuria Urinary Frequency  Patient presents with urinary frequency and dysuria since Sat, April 29th. She took Azo otc and did not notice improvement in sxs.  Stopped it a day later, still no change. In the past 1-2 days her urine has become more turbid.  No fevers or chills.  On ROS, she notes that she had a thin clear vaginal discharge yesterday which resolved spontaneously. She does not have it today.  She denies history of yeast, BV, or STI.  Her LMP was 2-1/2 weeks ago. She and husband are trying to conceive.    ROS: No fevers or chills, no vomiting but some nausea/loss of appetite. No cough, no chest pain.   Past Medical History  Diagnosis Date  . Bulimic   . Headache(784.0) 11/03  . Hypertension    Past Surgical History  Procedure Laterality Date  . Knee arthroscopy  1999    right knee   Family History  Problem Relation Age of Onset  . Breast cancer Paternal Grandmother   . Hypertension Maternal Grandmother   . Hypertension Mother   . Cancer Maternal Uncle     form of sarcoma  . Other Father     CTE   Social History  Substance Use Topics  . Smoking status: Never Smoker   . Smokeless tobacco: Never Used  . Alcohol Use: No   OB History    Gravida Para Term Preterm AB TAB SAB Ectopic Multiple Living       Review of Systems  Genitourinary: Positive for dysuria and frequency.    Allergies  Cat hair extract  Home Medications   Prior to Admission medications   Medication Sig Start Date End Date Taking? Authorizing  Provider  methyldopa (ALDOMET) 250 MG tablet Take 250 mg by mouth 2 (two) times daily. 09/02/14  Yes Historical Provider, MD  cephALEXin (KEFLEX) 500 MG capsule Take 1 capsule (500 mg total) by mouth 2 (two) times daily. 09/28/15   Barbaraann Barthel, MD  HYDROcodone-homatropine [facility]) 5-1.5 MG/5ML syrup Take 5 mLs by mouth every 4 (four) hours as needed for cough. 05/03/15   Sherren Mocha, MD  magic mouthwash w/lidocaine SOLN Take 10 mLs by mouth every 2 (two) hours as needed for mouth pain. 05/03/15   Sherren Mocha, MD  Prenatal Vit-Fe Fumarate-FA (PRENATAL PO) Take by mouth daily.    Historical Provider, MD   Meds Ordered and Administered this Visit  Medications - No data to display  BP 183/105 mmHg  Pulse 76  Temp(Src) 98.7 F (37.1 C) (Oral)  SpO2 100%  LMP 09/12/2015 (Approximate) No data found.   Physical Exam  Constitutional: She appears well-developed and well-nourished. No distress.  HENT:  Head: Normocephalic and atraumatic.  Mouth/Throat: Oropharynx is clear and moist. No oropharyngeal exudate.  Eyes: Conjunctivae are normal.  Neck: Normal range of motion. Neck supple.  Cardiovascular: Normal rate, regular rhythm and normal heart sounds.   Pulmonary/Chest: Effort normal and breath sounds normal. No respiratory distress. She has no wheezes. She has no  rales.  Abdominal: Soft. She exhibits no distension.  Suprapubic tenderness noted along midline, just below and extending above umbilicus.  Normal bowel sounds heard. No masses or rebound. NO RLQ or RUQ tenderness.  No CVA tenderness.  Lymphadenopathy:    She has no cervical adenopathy.  Skin: She is not diaphoretic.    ED Course  Procedures (including critical care time)  Labs Review Labs Reviewed  URINALYSIS, ROUTINE W REFLEX MICROSCOPIC (NOT AT [facility]) - Abnormal; Notable for the following:    APPearance CLOUDY (*)    Hgb urine dipstick SMALL (*)    Nitrite POSITIVE (*)    Leukocytes, UA LARGE (*)    All other components  within normal limits  URINE MICROSCOPIC-ADD ON - Abnormal; Notable for the following:    Squamous Epithelial / LPF 0-5 (*)    Bacteria, UA MANY (*)    All other components within normal limits  URINE CULTURE  POCT PREGNANCY, URINE    Imaging Review No results found.   Visual Acuity Review  Right Eye Distance:   Left Eye Distance:   Bilateral Distance:    Right Eye Near:   Left Eye Near:    Bilateral Near:         MDM   1. Cystitis    Patient with history and exam that are c/w uncomplicated cystitis. The UA dipstick reader unable to produce a result due to excessive leuks and nitrites, which is consistent with UTI.  Given lack of systemic sxs, will treat as simple cystitis.  Sending urine for formal UA with microscopy and urine cx due to inability to obtain a dipstick reading UA.   Treatment empirically with Keflex 500mg  bid x 10 days; patient reassured that Keflex is safe in pregnant women, as she is trying to conceive.   Paula ComptonJames Ranee Peasley, MD    Barbaraann BarthelJames O Kagan Hietpas, MD 09/28/15 2125

## 2015-10-01 LAB — URINE CULTURE

## 2016-03-23 ENCOUNTER — Encounter: Payer: Self-pay | Admitting: Obstetrics & Gynecology

## 2016-03-23 ENCOUNTER — Ambulatory Visit (INDEPENDENT_AMBULATORY_CARE_PROVIDER_SITE_OTHER): Payer: Federal, State, Local not specified - PPO | Admitting: Obstetrics & Gynecology

## 2016-03-23 VITALS — BP 124/70 | HR 84 | Resp 16 | Ht 69.0 in | Wt 186.0 lb

## 2016-03-23 DIAGNOSIS — Z124 Encounter for screening for malignant neoplasm of cervix: Secondary | ICD-10-CM | POA: Diagnosis not present

## 2016-03-23 DIAGNOSIS — Z01419 Encounter for gynecological examination (general) (routine) without abnormal findings: Secondary | ICD-10-CM | POA: Diagnosis not present

## 2016-03-23 NOTE — Progress Notes (Signed)
35 y.o. G0P0000 UnknownCaucasianF here for annual exam.  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    Cycles are regular.  On OCPs.  Decided to not proceed with adoption or sperm donation.   Patient's last menstrual period was 03/19/2016.          Sexually active: Yes.    The current method of family planning is none.    Exercising: Yes.    running, weights Smoker:  no  Health Maintenance: Pap:  09/16/13  Negative, 2013 HR HPV negative   History of abnormal Pap:  no MMG:  none Colonoscopy:  none BMD:   none TDaP:  2008  Hep C testing: not indicated  Screening Labs: PCP, Hb today: PCP, Urine today: PCP   reports that she has never smoked. She has never used smokeless tobacco. She reports that she does not drink alcohol or use drugs.  Past Medical History:  Diagnosis Date  . Bulimic   . Headache(784.0) 11/03  . Hypertension     Past Surgical History:  Procedure Laterality Date  . KNEE ARTHROSCOPY  1999   right knee    Current Outpatient Prescriptions  Medication Sig Dispense Refill  . losartan (COZAAR) 25 MG tablet Take 25 mg by mouth daily.    . Multiple Vitamin (MULTI-VITAMIN DAILY PO) Take by mouth daily.     No current facility-administered medications for this visit.     Family History  Problem Relation Age of Onset  . Breast cancer Paternal Grandmother   . Hypertension Maternal Grandmother   . Hypertension Mother   . Cancer Maternal Uncle     form of sarcoma  . Other Father     CTE    ROS:  Pertinent items are noted in HPI.  Otherwise, a comprehensive ROS was negative.  Exam:   BP 124/70 (BP Location: Right Arm, Patient Position: Sitting, Cuff Size: Normal)   Pulse 84   Resp 16   Ht 5\' 9"  (1.753 m)   Wt 186 lb (84.4 kg)   LMP 03/19/2016   BMI 27.47 kg/m   Weight change: +6# Height: 5\' 9"  (175.3 cm)  Ht Readings from Last 3 Encounters:  03/23/16 5\' 9"  (1.753 m)  05/03/15 5\' 10"  (1.778 m)  12/21/14 5\' 9"   (1.753 m)    General appearance: alert, cooperative and appears stated age Head: Normocephalic, without obvious abnormality, atraumatic Neck: no adenopathy, supple, symmetrical, trachea midline and thyroid normal to inspection and palpation Lungs: clear to auscultation bilaterally Breasts: normal appearance, no masses or tenderness Heart: regular rate and rhythm Abdomen: soft, non-tender; bowel sounds normal; no masses,  no organomegaly Extremities: extremities normal, atraumatic, no cyanosis or edema Skin: Skin color, texture, turgor normal. No rashes or lesions Lymph nodes: Cervical, supraclavicular, and axillary nodes normal. No abnormal inguinal nodes palpated Neurologic: Grossly normal   Pelvic: External genitalia:  no lesions              Urethra:  normal appearing urethra with no masses, tenderness or lesions              Bartholins and Skenes: normal                 Vagina: normal appearing vagina with normal color and discharge, no lesions              Cervix: no lesions              Pap taken: Yes.  Bimanual Exam:  Uterus:  normal size, contour, position, consistency, mobility, non-tender              Adnexa: normal adnexa and no mass, fullness, tenderness               Rectovaginal: Confirms               Anus:  normal sphincter tone, no lesions  Chaperone was present for exam.  A:   Well Woman with normal exam Hypertension H/O migraines with aura, resolved with chiropractor Family hx of colon polyps (m aunt and m uncle ages 7354, 6260) Remote hx of bulemia  Primary infertility due to azospermia  P: Mammogram starting age 35 Pap and HR HPV obtained today Colonoscopy mid 40's  Does not desire to restart OCPs. Return annually or prn

## 2016-03-27 ENCOUNTER — Ambulatory Visit (INDEPENDENT_AMBULATORY_CARE_PROVIDER_SITE_OTHER): Payer: Federal, State, Local not specified - PPO | Admitting: Family Medicine

## 2016-03-27 ENCOUNTER — Encounter: Payer: Self-pay | Admitting: Family Medicine

## 2016-03-27 DIAGNOSIS — S86811A Strain of other muscle(s) and tendon(s) at lower leg level, right leg, initial encounter: Secondary | ICD-10-CM

## 2016-03-27 DIAGNOSIS — S86819A Strain of other muscle(s) and tendon(s) at lower leg level, unspecified leg, initial encounter: Secondary | ICD-10-CM | POA: Insufficient documentation

## 2016-03-27 LAB — IPS PAP TEST WITH HPV

## 2016-03-27 NOTE — Patient Instructions (Signed)
You have a Grade 1 or low Grade 2 calf strain Compression sleeve or ace wrap to help with swelling and pain. Icing for 15 minutes at a time 3-4 times a day Heel lifts either in temporary orthotics or on their own to prevent further strain. Ibuprofen 600mg  three times a day with food OR aleve 2 tabs twice a day with food as needed. Start ankle range of motion exercises (up/down and alphabet exercises). Theraband exercises 3 sets of 10 once a day. When these are easy do 2 legged calf raises 3 sets of 10 once a day. Finally advance to single leg calf raises. Cycling with low resistance or swimming for cross training in meantime. I would avoid running, elliptical for at least a week.   When you return to running do walk:jog 1:1 for 10 minutes and advance every other day (increase total time by 5 minutes and jog time by 1 minute). Try to jog on level ground for next 4-6 weeks if possible. Follow up with me in 2-4 weeks for reevaluation.

## 2016-03-27 NOTE — Assessment & Plan Note (Signed)
consistent with Grade 1 or low Grade 2 strain.  Compression sleeve, icing, heel lifts, nsaids if needed.  Shown home exercises to do daily and how to advance these.  F/u in 2-4 weeks.

## 2016-03-27 NOTE — Progress Notes (Signed)
PCP: Emeterio ReeveWOLTERS,SHARON A, MD  Subjective:   HPI: Patient is a 35 y.o. female here for right calf pain.  Patient reports she started to get pain in right calf on 10/28 when running. Initially felt like a cramp. Then was getting up from sitting later and felt a sharp pain medial calf. No swelling or bruising though this area feels bruised. Was running about 12-15 miles a week prior to this. No skin changes, numbness. Pain is 3-4/10, dull.  Past Medical History:  Diagnosis Date  . Bulimic   . Headache(784.0) 11/03  . Hypertension     Current Outpatient Prescriptions on File Prior to Visit  Medication Sig Dispense Refill  . Multiple Vitamin (MULTI-VITAMIN DAILY PO) Take by mouth daily.     No current facility-administered medications on file prior to visit.     Past Surgical History:  Procedure Laterality Date  . KNEE ARTHROSCOPY  1999   right knee    Allergies  Allergen Reactions  . Cat Hair Extract     Social History   Social History  . Marital status: Unknown    Spouse name: N/A  . Number of children: N/A  . Years of education: N/A   Occupational History  . Not on file.   Social History Main Topics  . Smoking status: Never Smoker  . Smokeless tobacco: Never Used  . Alcohol use No  . Drug use: No  . Sexual activity: Yes    Partners: Male    Birth control/ protection: , None   Other Topics Concern  . Not on file   Social History Narrative  . No narrative on file    Family History  Problem Relation Age of Onset  . Breast cancer Paternal Grandmother   . Hypertension Maternal Grandmother   . Hypertension Mother   . Cancer Maternal Uncle     form of sarcoma  . Other Father     CTE    BP 134/75   Pulse 72   Ht 5\' 9"  (1.753 m)   Wt 185 lb (83.9 kg)   LMP 03/19/2016   BMI 27.32 kg/m   Review of Systems: See HPI above.    Objective:  Physical Exam:  Gen: NAD, comfortable in exam room  Right leg: No gross deformity, swelling,  bruising. TTP medial gastroc.  No other tenderness. FROM ankle and knee.  Pain with calf raise. Strength 5/5 all ankle motions. NVI distally.  MSK u/s:  No evidence tear, edema in area of pain right calf.    Assessment & Plan:  1. Right calf strain - consistent with Grade 1 or low Grade 2 strain.  Compression sleeve, icing, heel lifts, nsaids if needed.  Shown home exercises to do daily and how to advance these.  F/u in 2-4 weeks.

## 2017-07-05 ENCOUNTER — Encounter: Payer: Self-pay | Admitting: Obstetrics & Gynecology

## 2017-07-05 ENCOUNTER — Other Ambulatory Visit: Payer: Self-pay

## 2017-07-05 ENCOUNTER — Ambulatory Visit (INDEPENDENT_AMBULATORY_CARE_PROVIDER_SITE_OTHER): Payer: Federal, State, Local not specified - PPO | Admitting: Obstetrics & Gynecology

## 2017-07-05 VITALS — BP 150/96 | HR 68 | Resp 16 | Ht 69.0 in | Wt 186.0 lb

## 2017-07-05 DIAGNOSIS — Z01419 Encounter for gynecological examination (general) (routine) without abnormal findings: Secondary | ICD-10-CM | POA: Diagnosis not present

## 2017-07-05 NOTE — Progress Notes (Signed)
37 y.o. G0P0000 UnknownCaucasianF here for annual exam.  Doing well.  Finished school.  [employer] as Electrical engineer.  Dad died in 2022/09/08.    Cycles are still regular.  On OCPs.   Patient's last menstrual period was 06/10/2017.          Sexually active: Yes.    The current method of family planning is none.    Exercising: Yes.    weights and running Smoker:  no  Health Maintenance: Pap:  03/23/16 negative, HR HPV negative, 09/16/13 negative History of abnormal Pap:  no MMG:  n/a Colonoscopy:  n/a BMD:   n/a TDaP:  2008- declines today Pneumonia vaccine(s):  no Shingrix:   no Hep C testing: not indicated Screening Labs: PCP, Hb today: PCP, Urine today: not collected   reports that  has never smoked. she has never used smokeless tobacco. She reports that she does not drink alcohol or use drugs.  Past Medical History:  Diagnosis Date  . Bulimic   . Headache(784.0) 11/03  . Hypertension     Past Surgical History:  Procedure Laterality Date  . KNEE ARTHROSCOPY  1999   right knee    Current Outpatient Medications  Medication Sig Dispense Refill  . lisinopril (PRINIVIL,ZESTRIL) 10 MG tablet Take 10 mg by mouth daily.    . Multiple Vitamin (MULTI-VITAMIN DAILY PO) Take by mouth daily.     No current facility-administered medications for this visit.     Family History  Problem Relation Age of Onset  . Breast cancer Paternal Grandmother   . Hypertension Maternal Grandmother   . Hypertension Mother   . Cancer Maternal Uncle        form of sarcoma  . Other Father        CTE    ROS:  Pertinent items are noted in HPI.  Otherwise, a comprehensive ROS was negative.  Exam:   BP (!) 150/96 (BP Location: Right Arm, Patient Position: Sitting, Cuff Size: Normal)   Pulse 68   Resp 16   Ht 5\' 9"  (1.753 m)   Wt 186 lb (84.4 kg)   LMP 06/10/2017   BMI 27.47 kg/m   Weight change: stable   Height: 5\' 9"  (175.3 cm)  Ht Readings from Last 3 Encounters:  07/05/17 5\' 9"   (1.753 m)  03/27/16 5\' 9"  (1.753 m)  03/23/16 5\' 9"  (1.753 m)    General appearance: alert, cooperative and appears stated age Head: Normocephalic, without obvious abnormality, atraumatic Neck: no adenopathy, supple, symmetrical, trachea midline and thyroid normal to inspection and palpation Lungs: clear to auscultation bilaterally Breasts: normal appearance, no masses or tenderness Heart: regular rate and rhythm Abdomen: soft, non-tender; bowel sounds normal; no masses,  no organomegaly Extremities: extremities normal, atraumatic, no cyanosis or edema Skin: Skin color, texture, turgor normal. No rashes or lesions Lymph nodes: Cervical, supraclavicular, and axillary nodes normal. No abnormal inguinal nodes palpated Neurologic: Grossly normal   Pelvic: External genitalia:  no lesions              Urethra:  normal appearing urethra with no masses, tenderness or lesions              Bartholins and Skenes: normal                 Vagina: normal appearing vagina with normal color and discharge, no lesions              Cervix: no lesions  Pap taken: No. Bimanual Exam:  Uterus:  normal size, contour, position, consistency, mobility, non-tender              Adnexa: normal adnexa and no mass, fullness, tenderness               Rectovaginal: Confirms               Anus:  normal sphincter tone, no lesions  Chaperone was present for exam.  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  Declines today Checks BP at home and this is normal return annually or prn

## 2018-09-19 ENCOUNTER — Ambulatory Visit: Payer: Federal, State, Local not specified - PPO | Admitting: Obstetrics & Gynecology
